# Patient Record
Sex: Female | Born: 1970 | Race: White | Hispanic: No | Marital: Single | Smoking: Never smoker
Health system: Southern US, Community
[De-identification: ages and names within clinical notes are randomized; demographics above are authoritative.]

## PROBLEM LIST (undated history)

## (undated) DIAGNOSIS — J309 Allergic rhinitis, unspecified: Secondary | ICD-10-CM

## (undated) DIAGNOSIS — N926 Irregular menstruation, unspecified: Secondary | ICD-10-CM

## (undated) DIAGNOSIS — G4720 Circadian rhythm sleep disorder, unspecified type: Secondary | ICD-10-CM

## (undated) DIAGNOSIS — Z9109 Other allergy status, other than to drugs and biological substances: Secondary | ICD-10-CM

## (undated) DIAGNOSIS — L708 Other acne: Secondary | ICD-10-CM

## (undated) DIAGNOSIS — N939 Abnormal uterine and vaginal bleeding, unspecified: Secondary | ICD-10-CM

## (undated) DIAGNOSIS — L719 Rosacea, unspecified: Secondary | ICD-10-CM

## (undated) DIAGNOSIS — J45909 Unspecified asthma, uncomplicated: Secondary | ICD-10-CM

## (undated) HISTORY — DX: Other acne: L70.8

## (undated) HISTORY — DX: Unspecified asthma, uncomplicated: J45.909

## (undated) HISTORY — DX: Other allergy status, other than to drugs and biological substances: Z91.09

## (undated) HISTORY — DX: Abnormal uterine and vaginal bleeding, unspecified: N93.9

## (undated) HISTORY — DX: Irregular menstruation, unspecified: N92.6

## (undated) HISTORY — DX: Rosacea, unspecified: L71.9

## (undated) HISTORY — PX: DILATION AND CURETTAGE OF UTERUS: SHX78

## (undated) HISTORY — DX: Allergic rhinitis, unspecified: J30.9

## (undated) HISTORY — DX: Circadian rhythm sleep disorder, unspecified type: G47.20

## (undated) HISTORY — PX: NO PAST SURGERIES: SHX2092

---

## 1999-12-06 ENCOUNTER — Other Ambulatory Visit: Admission: RE | Admit: 1999-12-06 | Discharge: 1999-12-06 | Payer: Self-pay | Admitting: Gynecology

## 2001-11-02 ENCOUNTER — Other Ambulatory Visit: Admission: RE | Admit: 2001-11-02 | Discharge: 2001-11-02 | Payer: Self-pay | Admitting: Gynecology

## 2002-03-28 ENCOUNTER — Ambulatory Visit (HOSPITAL_COMMUNITY): Admission: RE | Admit: 2002-03-28 | Discharge: 2002-03-28 | Payer: Self-pay | Admitting: Gynecology

## 2002-03-28 ENCOUNTER — Encounter: Payer: Self-pay | Admitting: Gynecology

## 2002-12-10 ENCOUNTER — Other Ambulatory Visit: Admission: RE | Admit: 2002-12-10 | Discharge: 2002-12-10 | Payer: Self-pay | Admitting: Gynecology

## 2003-12-15 ENCOUNTER — Other Ambulatory Visit: Admission: RE | Admit: 2003-12-15 | Discharge: 2003-12-15 | Payer: Self-pay | Admitting: Gynecology

## 2003-12-20 ENCOUNTER — Inpatient Hospital Stay (HOSPITAL_COMMUNITY): Admission: AD | Admit: 2003-12-20 | Discharge: 2003-12-20 | Payer: Self-pay | Admitting: Obstetrics and Gynecology

## 2003-12-21 ENCOUNTER — Encounter (INDEPENDENT_AMBULATORY_CARE_PROVIDER_SITE_OTHER): Payer: Self-pay | Admitting: Specialist

## 2003-12-21 ENCOUNTER — Ambulatory Visit (HOSPITAL_COMMUNITY): Admission: AD | Admit: 2003-12-21 | Discharge: 2003-12-21 | Payer: Self-pay | Admitting: Obstetrics and Gynecology

## 2004-04-28 ENCOUNTER — Other Ambulatory Visit: Admission: RE | Admit: 2004-04-28 | Discharge: 2004-04-28 | Payer: Self-pay | Admitting: Gynecology

## 2004-08-30 ENCOUNTER — Encounter: Admission: RE | Admit: 2004-08-30 | Discharge: 2004-09-08 | Payer: Self-pay | Admitting: Gynecology

## 2004-10-11 ENCOUNTER — Ambulatory Visit (HOSPITAL_COMMUNITY): Admission: RE | Admit: 2004-10-11 | Discharge: 2004-10-11 | Payer: Self-pay | Admitting: Gynecology

## 2004-10-11 ENCOUNTER — Ambulatory Visit: Payer: Self-pay | Admitting: Obstetrics and Gynecology

## 2004-10-27 ENCOUNTER — Inpatient Hospital Stay (HOSPITAL_COMMUNITY): Admission: AD | Admit: 2004-10-27 | Discharge: 2004-10-29 | Payer: Self-pay | Admitting: Gynecology

## 2004-12-09 ENCOUNTER — Other Ambulatory Visit: Admission: RE | Admit: 2004-12-09 | Discharge: 2004-12-09 | Payer: Self-pay | Admitting: Gynecology

## 2005-09-06 ENCOUNTER — Other Ambulatory Visit: Admission: RE | Admit: 2005-09-06 | Discharge: 2005-09-06 | Payer: Self-pay | Admitting: Gynecology

## 2005-10-22 ENCOUNTER — Emergency Department (HOSPITAL_COMMUNITY): Admission: EM | Admit: 2005-10-22 | Discharge: 2005-10-22 | Payer: Self-pay | Admitting: Emergency Medicine

## 2005-10-31 ENCOUNTER — Ambulatory Visit: Payer: Self-pay | Admitting: Cardiovascular Disease

## 2005-10-31 ENCOUNTER — Ambulatory Visit: Payer: Self-pay | Admitting: Family Medicine

## 2005-11-11 ENCOUNTER — Ambulatory Visit: Payer: Self-pay | Admitting: Family Medicine

## 2005-11-25 ENCOUNTER — Ambulatory Visit: Payer: Self-pay | Admitting: Family Medicine

## 2005-12-20 ENCOUNTER — Other Ambulatory Visit: Admission: RE | Admit: 2005-12-20 | Discharge: 2005-12-20 | Payer: Self-pay | Admitting: Gynecology

## 2005-12-20 ENCOUNTER — Encounter: Payer: Self-pay | Admitting: Family Medicine

## 2006-05-16 ENCOUNTER — Ambulatory Visit: Payer: Self-pay | Admitting: Family Medicine

## 2006-06-12 ENCOUNTER — Encounter: Admission: RE | Admit: 2006-06-12 | Discharge: 2006-06-12 | Payer: Self-pay | Admitting: Family Medicine

## 2006-08-22 ENCOUNTER — Telehealth: Payer: Self-pay | Admitting: Family Medicine

## 2006-08-22 DIAGNOSIS — L708 Other acne: Secondary | ICD-10-CM

## 2006-08-22 DIAGNOSIS — G4720 Circadian rhythm sleep disorder, unspecified type: Secondary | ICD-10-CM | POA: Insufficient documentation

## 2006-08-22 DIAGNOSIS — N926 Irregular menstruation, unspecified: Secondary | ICD-10-CM

## 2006-08-22 DIAGNOSIS — J309 Allergic rhinitis, unspecified: Secondary | ICD-10-CM

## 2006-08-22 DIAGNOSIS — N939 Abnormal uterine and vaginal bleeding, unspecified: Secondary | ICD-10-CM

## 2006-08-22 HISTORY — DX: Circadian rhythm sleep disorder, unspecified type: G47.20

## 2006-08-22 HISTORY — DX: Irregular menstruation, unspecified: N92.6

## 2006-08-22 HISTORY — DX: Allergic rhinitis, unspecified: J30.9

## 2006-08-22 HISTORY — DX: Other acne: L70.8

## 2006-08-23 ENCOUNTER — Ambulatory Visit: Payer: Self-pay | Admitting: Family Medicine

## 2006-08-24 DIAGNOSIS — J45909 Unspecified asthma, uncomplicated: Secondary | ICD-10-CM | POA: Insufficient documentation

## 2006-08-24 HISTORY — DX: Unspecified asthma, uncomplicated: J45.909

## 2006-12-13 ENCOUNTER — Telehealth: Payer: Self-pay | Admitting: Family Medicine

## 2007-01-04 ENCOUNTER — Other Ambulatory Visit: Admission: RE | Admit: 2007-01-04 | Discharge: 2007-01-04 | Payer: Self-pay | Admitting: Gynecology

## 2007-05-23 ENCOUNTER — Telehealth (INDEPENDENT_AMBULATORY_CARE_PROVIDER_SITE_OTHER): Payer: Self-pay | Admitting: *Deleted

## 2007-07-02 DIAGNOSIS — W57XXXA Bitten or stung by nonvenomous insect and other nonvenomous arthropods, initial encounter: Secondary | ICD-10-CM | POA: Insufficient documentation

## 2007-07-02 DIAGNOSIS — T148 Other injury of unspecified body region: Secondary | ICD-10-CM

## 2007-07-04 ENCOUNTER — Ambulatory Visit: Payer: Self-pay | Admitting: Family Medicine

## 2008-01-07 ENCOUNTER — Other Ambulatory Visit: Admission: RE | Admit: 2008-01-07 | Discharge: 2008-01-07 | Payer: Self-pay | Admitting: Gynecology

## 2008-01-07 ENCOUNTER — Ambulatory Visit: Payer: Self-pay | Admitting: Women's Health

## 2008-01-07 ENCOUNTER — Encounter: Payer: Self-pay | Admitting: Women's Health

## 2008-02-11 ENCOUNTER — Telehealth: Payer: Self-pay | Admitting: Family Medicine

## 2008-11-28 ENCOUNTER — Ambulatory Visit: Payer: Self-pay | Admitting: Family Medicine

## 2008-12-17 ENCOUNTER — Telehealth: Payer: Self-pay | Admitting: Family Medicine

## 2009-01-09 ENCOUNTER — Ambulatory Visit: Payer: Self-pay | Admitting: Women's Health

## 2009-01-09 ENCOUNTER — Encounter: Admission: RE | Admit: 2009-01-09 | Discharge: 2009-01-09 | Payer: Self-pay | Admitting: Family Medicine

## 2009-01-09 ENCOUNTER — Other Ambulatory Visit: Admission: RE | Admit: 2009-01-09 | Discharge: 2009-01-09 | Payer: Self-pay | Admitting: Gynecology

## 2009-01-09 LAB — HM MAMMOGRAPHY: HM Mammogram: NEGATIVE

## 2009-01-14 ENCOUNTER — Telehealth: Payer: Self-pay | Admitting: Family Medicine

## 2010-01-11 ENCOUNTER — Encounter
Admission: RE | Admit: 2010-01-11 | Discharge: 2010-01-11 | Payer: Self-pay | Source: Home / Self Care | Attending: Family Medicine | Admitting: Family Medicine

## 2010-01-11 ENCOUNTER — Other Ambulatory Visit
Admission: RE | Admit: 2010-01-11 | Discharge: 2010-01-11 | Payer: Self-pay | Source: Home / Self Care | Admitting: Gynecology

## 2010-01-11 ENCOUNTER — Ambulatory Visit: Payer: Self-pay | Admitting: Women's Health

## 2010-06-11 NOTE — Op Note (Signed)
Diane Herring, Diane Herring                ACCOUNT NO.:  [id number]   MEDICAL RECORD NO.:  [id number]          PATIENT TYPE:  AMB   LOCATION:  SDC                           [facility]:  [facility]   PHYSICIAN:  James A. Ashley Royalty, M.D.DATE OF BIRTH:  1970-04-03   DATE OF PROCEDURE:  12/21/2003  DATE OF DISCHARGE:                                 OPERATIVE REPORT   PREOPERATIVE DIAGNOSIS:  Inevitable abortion.   POSTOPERATIVE DIAGNOSIS:  Inevitable abortion.  Pathology pending.   PROCEDURE:  Suction dilatation and curettage.   SURGEON:  Rudy Jew. Ashley Royalty, M.D.   ANESTHESIA:  Monitored anesthesia care, with 1% Xylocaine paracervical block  (20 cc).   FINDINGS:  Uterus sounded to 8.5 cm.   ESTIMATED BLOOD LOSS:  50 cc.   COMPLICATIONS:  None.   PACKS AND DRAINS:  None.   O positive blood type.   PROCEDURE:  The patient was taken to the operating room and placed in the  dorsal supine position.  After adequate IV sedation was administered, she  was placed in the lithotomy position and prepped and draped in the usual  manner for vaginal surgery.  Posterior weighted retractor was placed per  vagina.  The anterior lip of the cervix was grasped with a single-tooth  tenaculum.  The uterus was gently sounded to 8.5 cm.  Twenty cc of 1%  Xylocaine was used to infiltrate the cervix circumferentially to create a  good paracervical block.  The cervix was verified as already dilated to a  size 35 [place].  A 9 mm suction curet was then introduced into the uterine  cavity and suction applied.  A moderate amount of apparent products of  conception was delivered through the tubing.  After several passes with the  suction curet, no additional tissue was obtained.  At this point, the  operative part of the procedure was terminated.  The tenaculum was removed.  There was a small bleeder at the left aspect of the tenaculum site on  the cervix which was easily controlled with a 2-0 chromic interrupted  suture.   Hemostasis was noted and the procedure terminated.   The patient tolerated the procedure extremely well and was returned to the  recovery room in good condition.      JAM/MEDQ  D:  12/21/2003  T:  12/22/2003  Job:  [id number]   cc:   Ivor Costa. Farrel Gobble, M.D.  [address], [place]  [place]  [place] [address]  Fax: [phone]

## 2010-06-11 NOTE — H&P (Signed)
Diane Herring, Diane Herring                ACCOUNT NO.:  [id number]   MEDICAL RECORD NO.:  [id number]          PATIENT TYPE:  AMB   LOCATION:  SDC                           [facility]   PHYSICIAN:  James A. Ashley Royalty, M.D.DATE OF BIRTH:  [birth year]   DATE OF PROCEDURE:  DATE OF DISCHARGE:                      STAT - MUST CHANGE TO CORRECT WORK TYPE   A 40 year old, gravida 1, para 0, last menstrual period October 08, 2003,  10 weeks and 2 days gestation by menstrual dates.  The patient conceived via  Pergonal and states her date of conception was October 20, 2003, placing  her at or about 10 weeks 5 days today.  She presented to MAU last night  after calling first complaining of vaginal bleeding starting approximately 2  a.m. on October 26.  Evaluation in MAU last evening revealed the patient to  be having bleeding at the rate of less than a normal period without passage  of any tissue.  Pelvic evaluation revealed a modest amount of blood in the  vault.  The os was closed, and uterus was approximately 9 x 6 x 5 cm.  Subsequent ultrasound revealed a intrauterine gestational sack as well as an  embryo with crown rump length consistent with 6 weeks 6 days gestation and  no cardiac activity.  Options were discussed with the patient at that time  including expectant management versus aggressive management with a  dilatation and curettage.  She elected the former.  Quantitative beta HCG  was done, and the value was approximately 3400 MIU/mL.  She is Rh positive.   The patient called this afternoon complaining of increasing lower abdominal  discomfort.  She was asked to return to MAU at [facility].  She passed  a clot since arrival at MAU which allowed her pain to go from severe to  moderate.  Nonetheless, she continues to have moderate discomfort and  requests definitive therapy in the form of dilatation and curettage.   MEDICATIONS:  Vitamins.   PAST MEDICAL HISTORY:  Medical:   Asthma.   Surgical:  Negative.   ALLERGIES:  None   FAMILY HISTORY:  Noncontributory.   SOCIAL HISTORY:  The patient does not use tobacco or significant alcohol.   REVIEW OF SYSTEMS:  Noncontributory.   PHYSICAL EXAMINATION:  GENERAL: Well-developed, well-nourished, pleasant  white female in no acute distress.  VITAL SIGNS:  Blood pressure 127/89, pulse 70, respirations 20, temperature  96.9.  SKIN:  Warm and dry without lesions.  LYMPH:  No supraclavicular, cervical, or inguinal adenopathy.  HEENT:  Normocephalic.  NECK:  Supple without thyromegaly.  CHEST: Lungs are clear.  CARDIAC:  Regular rate and rhythm without murmurs, gallops, or rubs.  BREASTS:  Exam deferred.  ABDOMEN:  Soft and nontender without masses or organomegaly.  PELVIC:  External genitalia within normal limits.  There is moderate amount  of blood in the vault.  The os is open slightly.  Bimanual examination  reveals the uterus to be approximately 9 x 6 x 5 cm.  No adnexal masses are  palpable.   LABORATORY DATA:  Repeat HCG is obtained,  and the value is 1918 MIU/mL.  Hemoglobin 13.0, hematocrit 37.5, white blood count 11,400.   IMPRESSION:  1.  Inevitable abortion.  2.  Moderate to severe pelvic pain secondary to #1.   PLAN:  Suction dilatation and curettage.  Risks, benefits, complications,  and alteratives thoroughly discussed with the patient.  She states she  understands and accepts.  Questions invited and answered.      JAM/MEDQ  D:  12/21/2003  T:  12/21/2003  Job:  [id number]

## 2010-06-11 NOTE — H&P (Signed)
Diane Herring, Diane Herring                ACCOUNT NO.:  [id number]   MEDICAL RECORD NO.:  [id number]          PATIENT TYPE:  MAT   LOCATION:  MATC                          [facility]   PHYSICIAN:  James A. Ashley Royalty, M.D.DATE OF BIRTH:  1970/12/30   DATE OF ADMISSION:  12/20/2003  DATE OF DISCHARGE:                                HISTORY & PHYSICAL   This is a 40 year old prima gravida, last menstrual period October 08, 2003, 10 weeks, 1 day gestation by menstrual dates.  The patient conceived  via Pergonal and states her date of conception was October 20, 2003,  placing her at 10 weeks, 4 days gestation by that dating parameter.  The  patient stated she noticed the onset of vaginal bleeding at 2 a.m. today.  Initially it was brown and has become redder over time.  In quantity it is  still substantially less than that of a normal menstrual period.  She denies  any passage of tissue.   MEDICATIONS:  Vitamins.   PAST MEDICAL HISTORY:  Medical asthma.   SURGICAL HISTORY:  Negative.   ALLERGIES:  None.   FAMILY HISTORY:  Noncontributory.   SOCIAL HISTORY:  The patient denies use of tobacco or significant alcohol.   REVIEW OF SYSTEMS:  Noncontributory.   PHYSICAL EXAMINATION:  GENERAL:  Well-developed, well-nourished, pleasant,  white female in no acute distress.  VITAL SIGNS:  Afebrile, vital signs stable, blood pressure 137/79.  SKIN:  Warm and dry without lesions.  LYMPH:  There is no supraclavicular, cervical, or inguinal adenopathy.  HEENT:  Normocephalic.  NECK:  Supple.  No thyromegaly.  CHEST AND LUNGS:  Clear.  CARDIAC:  Regular rate and rhythm without murmurs, gallops, or rubs.  BREAST EXAM:  Deferred.  ABDOMEN:  Soft and nontender without masses or organomegaly.  PELVIC:  External genitalia is within normal limits.  Vagina and cervix are  without gross lesions.  There is approximately 5 mL of old blood in the  vaginal vault.  The cervical os is closed.  There is  no tissue.  Bimanual  examination reveals the uterus to be approximately 9 x 6 x 5 cm and no  adnexal masses are palpable.   Obstetrical ultrasound reveals an intrauterine gestational sac.  An embryo  is visible with a crown-rump length of 8.4 mm consistent with 6 weeks, 6  days gestation.  There is no cardiac activity visible.  There was no  evidence of any subchorionic hemorrhage.  The right ovary contains a 10 x 13  mm cystic lesion.  The left ovary demonstrates a 2.2 x 2.3 x 2.7 cm  probable corpus luteum cyst.  There was no free fluid.   IMPRESSION:  Probable spontaneous abortion.   PLAN:  Discussed with patient the options for management including expectant  management with serial ECGs versus dilatation and curettage.  She elects the  former.  Hence, we will obtain a quantitative beta hCG today and repeat on  November 28 or November 29 per discretion of Dr. Farrel Gobble.  Risks, benefits,  complications, and proposed plans of management discussed and  accepted.  Questions invited and answered.      JAM/MEDQ  D:  12/20/2003  T:  12/20/2003  Job:  [id number]   cc:   Ivor Costa. Farrel Gobble, M.D.  [address], [place]  [place]  [place] [address]  Fax: [phone]

## 2010-06-11 NOTE — H&P (Signed)
NAMENICHOLLE, FALZON                ACCOUNT NO.:  [id number]   MEDICAL RECORD NO.:  [id number]          PATIENT TYPE:  INP   LOCATION:  9165                          [facility]  [facility]   PHYSICIAN:  Timothy P. Fontaine, M.D.DATE OF BIRTH:  27-Jul-1970   DATE OF ADMISSION:  10/27/2004  DATE OF DISCHARGE:                                HISTORY & PHYSICAL   CHIEF COMPLAINT:  Pregnancy at 36 weeks. Spontaneous rupture of membranes.   HISTORY OF PRESENT ILLNESS:  40 year old G2, P0 female [redacted] weeks gestation  being followed for gestational diabetes, 42 units of NPH Insulin at bedtime,  good control with spontaneous rupture of membranes at approximately 1 A.M.  The patient was admitted with fingertip dilatation with contractions every 3  to 8 minutes.  Her prenatal course has been uncomplicated other than he  gestational diabetes.  She had a recent beta Strep vaginal culture which was  negative. For the remainder of her history see her [place].   PHYSICAL EXAMINATION:  VITAL SIGNS:  She is afebrile and vital signs are  stable.  HEENT:  Normal.  LUNGS:  Clear.  CARDIAC:  Regular rate and rhythm with no murmurs, rubs or gallops.  ABDOMEN:  Gravid, vertex, consistent with dates.  External monitors with  reactive fetal tracing, irregular contractions.  PELVIC:  On pelvic exam 80% effaced, tight fingertip dilated, posterior,  vertex presentation, clear fluid noted.  IUPC placed.   ASSESSMENT/PLAN:  40 year old G2, P0 female [redacted] weeks gestation, gestational  diabetes, spontaneous rupture of membranes, initially contracting, now with  more irregular contractions.  The patient is on insulin for gestational  diabetes with good control.  Fluid noted to be clear.  Recent Strep culture  negative.  IUPC placed.  External monitors with reactive reassuring fetus.  Will begin Pitocin augmentation for planned vaginal delivery.  The plan was  reviewed with the patient and her husband who understand and  agree.      Timothy P. Fontaine, M.D.  Electronically Signed     TPF/MEDQ  D:  10/27/2004  T:  10/27/2004  Job:  [id number]

## 2010-09-16 ENCOUNTER — Encounter: Payer: Self-pay | Admitting: Family Medicine

## 2010-10-05 ENCOUNTER — Encounter: Payer: Self-pay | Admitting: Family Medicine

## 2010-11-25 ENCOUNTER — Encounter: Payer: Self-pay | Admitting: Family Medicine

## 2010-11-29 ENCOUNTER — Ambulatory Visit (INDEPENDENT_AMBULATORY_CARE_PROVIDER_SITE_OTHER): Payer: BC Managed Care – PPO | Admitting: Family Medicine

## 2010-11-29 ENCOUNTER — Encounter: Payer: Self-pay | Admitting: Family Medicine

## 2010-11-29 DIAGNOSIS — G4720 Circadian rhythm sleep disorder, unspecified type: Secondary | ICD-10-CM

## 2010-11-29 DIAGNOSIS — Z23 Encounter for immunization: Secondary | ICD-10-CM

## 2010-11-29 DIAGNOSIS — J45909 Unspecified asthma, uncomplicated: Secondary | ICD-10-CM

## 2010-11-29 DIAGNOSIS — J309 Allergic rhinitis, unspecified: Secondary | ICD-10-CM

## 2010-11-29 LAB — POCT URINALYSIS DIPSTICK
Bilirubin, UA: NEGATIVE
Glucose, UA: NEGATIVE
Ketones, UA: NEGATIVE
Leukocytes, UA: NEGATIVE
Nitrite, UA: NEGATIVE
Protein, UA: NEGATIVE
Spec Grav, UA: 1.025
Urobilinogen, UA: 0.2
pH, UA: 5

## 2010-11-29 LAB — CBC WITH DIFFERENTIAL/PLATELET
Basophils Absolute: 0 10*3/uL (ref 0.0–0.1)
Basophils Relative: 0.6 % (ref 0.0–3.0)
Eosinophils Absolute: 0.1 10*3/uL (ref 0.0–0.7)
Eosinophils Relative: 0.9 % (ref 0.0–5.0)
HCT: 38.8 % (ref 36.0–46.0)
Hemoglobin: 13.3 g/dL (ref 12.0–15.0)
Lymphocytes Relative: 30.5 % (ref 12.0–46.0)
Lymphs Abs: 2.3 10*3/uL (ref 0.7–4.0)
MCHC: 34.3 g/dL (ref 30.0–36.0)
MCV: 96.2 fl (ref 78.0–100.0)
Monocytes Absolute: 0.4 10*3/uL (ref 0.1–1.0)
Monocytes Relative: 5.4 % (ref 3.0–12.0)
Neutro Abs: 4.8 10*3/uL (ref 1.4–7.7)
Neutrophils Relative %: 62.6 % (ref 43.0–77.0)
Platelets: 207 10*3/uL (ref 150.0–400.0)
RBC: 4.04 Mil/uL (ref 3.87–5.11)
RDW: 12.1 % (ref 11.5–14.6)
WBC: 7.7 10*3/uL (ref 4.5–10.5)

## 2010-11-29 LAB — TSH: TSH: 1.74 u[IU]/mL (ref 0.35–5.50)

## 2010-11-29 LAB — HEPATIC FUNCTION PANEL
ALT: 29 U/L (ref 0–35)
AST: 18 U/L (ref 0–37)
Albumin: 3.8 g/dL (ref 3.5–5.2)
Alkaline Phosphatase: 109 U/L (ref 39–117)
Bilirubin, Direct: 0.1 mg/dL (ref 0.0–0.3)
Total Bilirubin: 0.9 mg/dL (ref 0.3–1.2)
Total Protein: 6.7 g/dL (ref 6.0–8.3)

## 2010-11-29 LAB — LIPID PANEL
Cholesterol: 184 mg/dL (ref 0–200)
HDL: 62.8 mg/dL (ref >39.00)
LDL Cholesterol: 95 mg/dL (ref 0–99)
Total CHOL/HDL Ratio: 3
Triglycerides: 131 mg/dL (ref 0.0–149.0)
VLDL: 26.2 mg/dL (ref 0.0–40.0)

## 2010-11-29 MED ORDER — BECLOMETHASONE DIPROPIONATE 40 MCG/ACT IN AERS: 2.0000 | INHALATION_SPRAY | Freq: Two times a day (BID) | RESPIRATORY_TRACT | Status: AC

## 2010-11-29 MED ORDER — ALBUTEROL SULFATE HFA 108 (90 BASE) MCG/ACT IN AERS: 2.0000 | INHALATION_SPRAY | Freq: Four times a day (QID) | RESPIRATORY_TRACT | Status: DC | PRN

## 2010-11-29 NOTE — Patient Instructions (Signed)
Continue your current medications.  Consider a consult with Dr. Rolly Pancake, [place], because of the sleep dysfunction.  Return for a 30 minute appointment sometime in the next week or two for removal of the lesions on her back

## 2010-11-29 NOTE — Progress Notes (Signed)
  Subjective:    Patient ID: Diane Herring, female    DOB: 1970/08/17, 40 y.o.   MRN: [id number]  Diane Herring is a 40 year old, married female, nonsmoker, G2, P2, who comes in today for general physical examination because of a history of allergic rhinitis, asthma, and sleep dysfunction.  She takes her Qvar and albuterol p.r.n. Only has a flare once or twice a year.  She also takes Zyrtec 10 mg nightly for allergic rhinitis.  She's tried many medications for sleep dysfunction noted, which is work.  She's currently taking over-the-counter Unisom.  We discussed consultation with Diane Herring.  She is off her BCPs because her husband Diane Herring.      Review of Systems  Constitutional: Negative.   HENT: Negative.   Eyes: Negative.   Respiratory: Negative.   Cardiovascular: Negative.   Gastrointestinal: Negative.   Genitourinary: Negative.   Musculoskeletal: Negative.   Neurological: Negative.   Hematological: Negative.   Psychiatric/Behavioral: Negative.        Objective:   Physical Exam  Constitutional: She appears well-developed and well-nourished.  HENT:  Head: Normocephalic and atraumatic.  Right Ear: External ear normal.  Left Ear: External ear normal.  Nose: Nose normal.  Mouth/Throat: Oropharynx is clear and moist.  Eyes: EOM are normal. Pupils are equal, round, and reactive to light.  Neck: Normal range of motion. Neck supple. No thyromegaly present.  Cardiovascular: Normal rate, regular rhythm, normal heart sounds and intact distal pulses.  Exam reveals no gallop and no friction rub.   No murmur heard. Pulmonary/Chest: Effort normal and breath sounds normal.  Abdominal: Soft. Bowel sounds are normal. She exhibits no distension and no mass. There is no tenderness. There is no rebound.  Genitourinary:       Breast exam shows multiple fibrocystic changes throughout both breasts.  No dominant lesion  Musculoskeletal: Normal range of motion.  Lymphadenopathy:    She  has no cervical adenopathy.  Neurological: She is alert. She has normal reflexes. No cranial nerve deficit. She exhibits normal muscle tone. Coordination normal.  Skin: Skin is warm and dry.  Psychiatric: She has a normal mood and affect. Her behavior is normal. Judgment and thought content normal.  There are two small 2-mm black lesions on her low back advised to return for removal ASAP        Assessment & Plan:  Healthy female.  History of allergic asthma.  Continue current medication.  Sleep dysfunction consider consult from Diane Herring.  Two abnormal lesions on her back.  Return for removal ASAP

## 2010-11-30 LAB — BASIC METABOLIC PANEL
BUN: 11 mg/dL (ref 6–23)
CO2: 21 mEq/L (ref 19–32)
Calcium: 9 mg/dL (ref 8.4–10.5)
Chloride: 106 mEq/L (ref 96–112)
Creatinine, Ser: 0.6 mg/dL (ref 0.4–1.2)
GFR: 112.18 mL/min (ref >60.00)
Glucose, Bld: 82 mg/dL (ref 70–99)
Potassium: 4.2 mEq/L (ref 3.5–5.1)
Sodium: 140 mEq/L (ref 135–145)

## 2010-11-30 NOTE — Progress Notes (Signed)
Addended by: Rita Ohara R on: 11/30/2010 11:19 AM   Modules accepted: Orders

## 2010-12-07 ENCOUNTER — Ambulatory Visit (INDEPENDENT_AMBULATORY_CARE_PROVIDER_SITE_OTHER): Payer: BC Managed Care – PPO | Admitting: Family Medicine

## 2010-12-07 ENCOUNTER — Encounter: Payer: Self-pay | Admitting: Family Medicine

## 2010-12-07 DIAGNOSIS — D239 Other benign neoplasm of skin, unspecified: Secondary | ICD-10-CM

## 2010-12-07 NOTE — Progress Notes (Signed)
  Subjective:    Patient ID: Diane Herring, female    DOB: [birth year], 40 y.o.   MRN: [id number]  HPI  Lean is a 40 year old, married female, nonsmoker, who comes in today for removal of 3 lesions on her back.  We saw her last week for a physical examination.  She has slight skin and light eyes.  Total body skin exam was normal except for 3 black lesions on her back.  After informed consent.  Each lesion was cleaned and then anesthetized with 1% Xylocaine with epinephrine.  They were removed with 3-mm margins bases were cauterized.  Band-Aids were applied.  She tolerated the procedure.  No complications.  Lesion number one ,,,,,,,,,, midline T10, 5 mm times 5 mm  Lesion number two.......... To the right lumbar area 5 mm times 5 mm.  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  Path pending.  Rule out melanoma

## 2010-12-07 NOTE — Patient Instructions (Addendum)
Remove the Band-Aid tomorrow.  If we do not call you within two weeks.  Call us

## 2010-12-08 ENCOUNTER — Other Ambulatory Visit: Payer: Self-pay | Admitting: Family Medicine

## 2010-12-08 DIAGNOSIS — Z1231 Encounter for screening mammogram for malignant neoplasm of breast: Secondary | ICD-10-CM

## 2011-01-13 ENCOUNTER — Ambulatory Visit: Payer: BC Managed Care – PPO

## 2011-01-21 ENCOUNTER — Encounter: Payer: BC Managed Care – PPO | Admitting: Women's Health

## 2011-01-25 HISTORY — PX: BREAST BIOPSY: SHX20

## 2011-01-27 ENCOUNTER — Ambulatory Visit (INDEPENDENT_AMBULATORY_CARE_PROVIDER_SITE_OTHER): Payer: BC Managed Care – PPO | Admitting: Women's Health

## 2011-01-27 ENCOUNTER — Encounter: Payer: Self-pay | Admitting: Women's Health

## 2011-01-27 ENCOUNTER — Other Ambulatory Visit (HOSPITAL_COMMUNITY)
Admission: RE | Admit: 2011-01-27 | Discharge: 2011-01-27 | Disposition: A | Discharge status: Home / Self Care | Payer: BC Managed Care – PPO | Source: Ambulatory Visit | Attending: Obstetrics and Gynecology | Admitting: Obstetrics and Gynecology

## 2011-01-27 VITALS — BP 108/74 | Ht 61.0 in | Wt 138.0 lb

## 2011-01-27 DIAGNOSIS — Z01419 Encounter for gynecological examination (general) (routine) without abnormal findings: Secondary | ICD-10-CM | POA: Insufficient documentation

## 2011-01-27 NOTE — Progress Notes (Signed)
Addended by: Venora Maples on: 01/27/2011 11:26 AM   Modules accepted: Orders

## 2011-01-27 NOTE — Progress Notes (Signed)
Diane UNCAPHER 1970/09/17 [id number]    History:    The patient presents for annual exam. Monthly 3 day cycle/vasectomy with no complaints. History of normal Paps since 2006. Labs at primary care.    Past medical history, past surgical history, family history and social history were all reviewed and documented in the EPIC chart. History of GDM and asthma. [name] 5 doing well, stepchildren 17 and 14 doing well and both had Gardasil.   ROS:  A  ROS was performed and pertinent positives and negatives are included in the history.  Exam:  Filed Vitals:   01/27/11 1007  BP: 108/74    General appearance:  Normal Head/Neck:  Normal, without cervical or supraclavicular adenopathy. Thyroid:  Symmetrical, normal in size, without palpable masses or nodularity. Respiratory  Effort:  Normal  Auscultation:  Clear without wheezing or rhonchi Cardiovascular  Auscultation:  Regular rate, without rubs, murmurs or gallops  Edema/varicosities:  Not grossly evident Abdominal  Soft,nontender, without masses, guarding or rebound.  Liver/spleen:  No organomegaly noted  Hernia:  None appreciated  Skin  Inspection:  Grossly normal  Palpation:  Grossly normal Neurologic/psychiatric  Orientation:  Normal with appropriate conversation.  Mood/affect:  Normal  Genitourinary    Breasts: Examined lying and sitting.     Right: Without masses, retractions, discharge or axillary adenopathy.     Left: Without masses, retractions, discharge or axillary adenopathy.   Inguinal/mons:  Normal without inguinal adenopathy  External genitalia:  Normal  BUS/Urethra/Skene's glands:  Normal  Bladder:  Normal  Vagina:  Normal  Cervix:  Normal  Uterus:   normal in size, shape and contour.  Midline and mobile  Adnexa/parametria:     Rt: Without masses or tenderness.   Lt: Without masses or tenderness.  Anus and perineum: Normal  Digital rectal exam: Normal sphincter tone without palpated masses or  tenderness  Assessment/Plan:  41 y.o. MWF G1 P1 for annual exam.    Normal GYN exam Asthma-primary care/ inhaler in labs  Plan: Pap only. SBEs, annual mammogram, (scheduled next week) exercise, calcium rich diet encouraged. Continue care at  primary care for labs and asthma medication.  Harrington Challenger [facility], 10:54 AM 01/27/2011

## 2011-01-31 ENCOUNTER — Telehealth: Payer: Self-pay | Admitting: *Deleted

## 2011-01-31 NOTE — Telephone Encounter (Signed)
Stop the amoxicillin and come to the office at 815 in the morning

## 2011-01-31 NOTE — Telephone Encounter (Signed)
Pt was given Amoxicillin at [facility] for sinus last Friday and has a rash.  Wants to know if Dr. Tawanna Cooler will see her or tell her what to do?

## 2011-01-31 NOTE — Telephone Encounter (Signed)
Spoke with patient and she will be here tomorrow at 8:15.

## 2011-02-01 ENCOUNTER — Encounter: Payer: Self-pay | Admitting: Family Medicine

## 2011-02-01 ENCOUNTER — Ambulatory Visit (INDEPENDENT_AMBULATORY_CARE_PROVIDER_SITE_OTHER): Payer: BC Managed Care – PPO | Admitting: Family Medicine

## 2011-02-01 ENCOUNTER — Ambulatory Visit
Admission: RE | Admit: 2011-02-01 | Discharge: 2011-02-01 | Disposition: A | Discharge status: Home / Self Care | Payer: BC Managed Care – PPO | Source: Ambulatory Visit | Attending: Family Medicine | Admitting: Family Medicine

## 2011-02-01 DIAGNOSIS — J309 Allergic rhinitis, unspecified: Secondary | ICD-10-CM

## 2011-02-01 DIAGNOSIS — Z1231 Encounter for screening mammogram for malignant neoplasm of breast: Secondary | ICD-10-CM

## 2011-02-01 DIAGNOSIS — Z888 Allergy status to other drugs, medicaments and biological substances status: Secondary | ICD-10-CM

## 2011-02-01 DIAGNOSIS — T7840XA Allergy, unspecified, initial encounter: Secondary | ICD-10-CM | POA: Insufficient documentation

## 2011-02-01 DIAGNOSIS — J45909 Unspecified asthma, uncomplicated: Secondary | ICD-10-CM

## 2011-02-01 MED ORDER — PREDNISONE 20 MG PO TABS: ORAL_TABLET | ORAL | Status: DC

## 2011-02-01 NOTE — Patient Instructions (Signed)
Take the prednisone as directed  Stop the  Qvar for the next 9 days........Marland KitchenMarland KitchenAnd then restart the Qvar when you begin to taper the prednisone Monday Wednesday Friday  Use the nasal irrigation with Afrin at bedtime for the next 5 nights  Return p.r.n.

## 2011-02-01 NOTE — Progress Notes (Signed)
  Subjective:    Patient ID: Diane Herring, female    DOB: 1970/10/31, 41 y.o.   MRN: [id number]  HPI Cj is a 41 year old married female nonsmoker who comes in today for evaluation of multiple issues  She states the day after Christmas she developed a cold manifested by head congestion sore throat and mild cough.  Five days ago she went to an urgent care because of head congestion was told she had sinusitis and given amoxicillin.  She took it and then 4 days later broke out in a rash.  She has a history of underlying allergic rhinitis and asthma.  She is currently taken Zyrtec, albuterol p.r.n., Q. There two puffs b.i.d.  She has no fever no facial pain  She called yesterday and we advised her to stop the amoxicillin.  She had no symptoms of airway obstruction.  In no previous history of drug reactions no   Review of Systems General and ENT and pulmonary review of systems otherwise negative    Objective:   Physical Exam  Well-developed well-nourished female in no acute distress HEENT negative except for postnasal edema neck supple no adenopathy lungs are clear except for mild expiratory wheezing.  Skin exam shows a reticular rash on her abdomen arms consistent with a mild allergic reaction.      Assessment & Plan:  Viral syndrome  Allergic rhinitis  Asthma mild  Drug reaction from amoxicillin  Plan hold of the Qvar begin prednisone burst and taper which will cover the allergic reaction and the mild asthma.  Return p.r.n. and

## 2011-02-04 ENCOUNTER — Other Ambulatory Visit: Payer: Self-pay | Admitting: Family Medicine

## 2011-02-04 DIAGNOSIS — R928 Other abnormal and inconclusive findings on diagnostic imaging of breast: Secondary | ICD-10-CM

## 2011-02-18 ENCOUNTER — Other Ambulatory Visit: Payer: Self-pay | Admitting: Family Medicine

## 2011-02-18 ENCOUNTER — Ambulatory Visit
Admission: RE | Admit: 2011-02-18 | Discharge: 2011-02-18 | Disposition: A | Discharge status: Home / Self Care | Payer: BC Managed Care – PPO | Source: Ambulatory Visit | Attending: Family Medicine | Admitting: Family Medicine

## 2011-02-18 DIAGNOSIS — N6320 Unspecified lump in the left breast, unspecified quadrant: Secondary | ICD-10-CM

## 2011-02-18 DIAGNOSIS — R928 Other abnormal and inconclusive findings on diagnostic imaging of breast: Secondary | ICD-10-CM

## 2011-02-28 ENCOUNTER — Ambulatory Visit
Admission: RE | Admit: 2011-02-28 | Discharge: 2011-02-28 | Disposition: A | Discharge status: Home / Self Care | Payer: BC Managed Care – PPO | Source: Ambulatory Visit | Attending: Family Medicine | Admitting: Family Medicine

## 2011-02-28 ENCOUNTER — Other Ambulatory Visit: Payer: Self-pay | Admitting: Family Medicine

## 2011-02-28 DIAGNOSIS — N6320 Unspecified lump in the left breast, unspecified quadrant: Secondary | ICD-10-CM

## 2011-02-28 DIAGNOSIS — R928 Other abnormal and inconclusive findings on diagnostic imaging of breast: Secondary | ICD-10-CM

## 2011-06-16 ENCOUNTER — Ambulatory Visit (INDEPENDENT_AMBULATORY_CARE_PROVIDER_SITE_OTHER): Payer: BC Managed Care – PPO | Admitting: Family Medicine

## 2011-06-16 ENCOUNTER — Encounter: Payer: Self-pay | Admitting: Family Medicine

## 2011-06-16 VITALS — BP 110/74 | Temp 98.5°F | Wt 142.0 lb

## 2011-06-16 DIAGNOSIS — G4733 Obstructive sleep apnea (adult) (pediatric): Secondary | ICD-10-CM | POA: Insufficient documentation

## 2011-06-16 DIAGNOSIS — G4730 Sleep apnea, unspecified: Secondary | ICD-10-CM

## 2011-06-16 DIAGNOSIS — J309 Allergic rhinitis, unspecified: Secondary | ICD-10-CM

## 2011-06-16 DIAGNOSIS — J45909 Unspecified asthma, uncomplicated: Secondary | ICD-10-CM

## 2011-06-16 MED ORDER — MONTELUKAST SODIUM 10 MG PO TABS: 10.0000 mg | ORAL_TABLET | Freq: Every day | ORAL | Status: DC

## 2011-06-16 MED ORDER — PREDNISONE 20 MG PO TABS: ORAL_TABLET | ORAL | Status: DC

## 2011-06-16 NOTE — Patient Instructions (Signed)
Singulair 10 mg,,,,,,,,, 1 tablet daily at bedtime  Continue Zyrtec 10 mg each bedtime  Restart the prednisone 2 tabs for 3 days with a taper

## 2011-06-16 NOTE — Progress Notes (Signed)
  Subjective:    Patient ID: Diane Herring, female    DOB: 07-27-1970, 41 y.o.   MRN: [id number]  HPI Diane Herring is a 41 year old married female nonsmoker who comes in today for evaluation of allergic rhinitis and asthma  Her allergies flared up about 2 months ago. She takes Zyrtec daily and took a three-week course of prednisone which she finished 10 days ago yesterday she began wheezing.  In the past I referred her to Dr. [name] because of sleep dysfunction. Referred her for a sleep study and she was diagnosed as sleep apnea however she's not started the CPAP  Review of Systems    general and pulmonary review of systems otherwise negative Objective:   Physical Exam Well-developed well-nourished female in no acute distress HEENT negative neck was supple no adenopathy lungs are clear except for some mild wheezing late expiratory       Assessment & Plan:  Allergic rhinitis  Asthma  Plan..........Marland Kitchen restart prednisone continue Zyrtec add Singulair return in 2 weeks for followup

## 2011-06-30 ENCOUNTER — Ambulatory Visit: Payer: BC Managed Care – PPO | Admitting: Family Medicine

## 2011-07-04 ENCOUNTER — Ambulatory Visit (INDEPENDENT_AMBULATORY_CARE_PROVIDER_SITE_OTHER): Payer: BC Managed Care – PPO | Admitting: Family Medicine

## 2011-07-04 ENCOUNTER — Encounter: Payer: Self-pay | Admitting: Family Medicine

## 2011-07-04 VITALS — BP 110/80 | HR 88 | Temp 98.8°F | Wt 144.0 lb

## 2011-07-04 DIAGNOSIS — J45909 Unspecified asthma, uncomplicated: Secondary | ICD-10-CM

## 2011-07-04 DIAGNOSIS — G4730 Sleep apnea, unspecified: Secondary | ICD-10-CM

## 2011-07-04 DIAGNOSIS — J309 Allergic rhinitis, unspecified: Secondary | ICD-10-CM

## 2011-07-04 MED ORDER — KETOTIFEN FUMARATE 0.025 % OP SOLN: 1.0000 [drp] | Freq: Two times a day (BID) | OPHTHALMIC | Status: AC

## 2011-07-04 NOTE — Progress Notes (Signed)
  Subjective:    Patient ID: Diane Herring, female    DOB: [birth year], 41 y.o.   MRN: [id number]  HPI Diane Herring is a 41 year old married female nonsmoker who comes in today for evaluation of 2 problems  We are tapering her prednisone and this is her last week of a half a tablet Monday Wednesday Friday. While she's been on the prednisone she's held the Qvar. She continues the Singulair 10 mg daily  She also has a history of sleep dysfunction I initially sent her to Diane Herring because of all the medications we tried did not help.. She felt like she might have sleep apnea and did a sleep study which confirmed it however Diane Herring has been reluctant to try the CPAP. I will ask her to see Diane Herring for a consult She's also complaining of swelling and itching of her eyes despite taking the Zyrtec 10 mg at bedtime   Review of Systems    general and pulmonary review of systems otherwise negative Objective:   Physical Exam  Well-developed well-nourished female in no acute distress HEENT negative neck was supple no adenopathy lungs are clear      Assessment & Plan:  Allergic asthma much improved plan Taper prednisone restart Qvar  Allergic eyes eyedrops  History of sleep apnea consult with Dr.  Mellody Herring

## 2011-07-04 NOTE — Patient Instructions (Signed)
Use the eyedrops ,,,,,,,,, 1 drop in each eye twice daily  I put in a consult request for you to see Dr. Mellody Dance clance who is the chief of the sleep apnea division and pulmonary  Restart Qvar 2 puffs twice daily  Return yearly for a thorough skin exam

## 2011-07-27 ENCOUNTER — Ambulatory Visit (INDEPENDENT_AMBULATORY_CARE_PROVIDER_SITE_OTHER): Payer: BC Managed Care – PPO | Admitting: Pulmonary Disease

## 2011-07-27 ENCOUNTER — Encounter: Payer: Self-pay | Admitting: Pulmonary Disease

## 2011-07-27 VITALS — BP 116/62 | HR 105 | Temp 98.7°F | Ht 61.0 in | Wt 143.8 lb

## 2011-07-27 DIAGNOSIS — G4733 Obstructive sleep apnea (adult) (pediatric): Secondary | ICD-10-CM

## 2011-07-27 NOTE — Progress Notes (Signed)
Subjective:    Patient ID: Diane Herring, female    DOB: [birth year], 41 y.o.   MRN: [id number]  HPI The patient is a 41 year old female who I've been asked to see for management of obstructive sleep apnea.  She underwent a sleep study in December of last year, where she was found to have moderate obstructive sleep apnea.  Was found to have an AHI of 27 events per hour.  The patient states she has been noted to have loud snoring, but no one has commented on an abnormal breathing pattern during sleep.  She has frequent awakenings at night, and is not rested in the mornings upon arising.  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  She has no issues in the evenings with excessive sleepiness or dozing, and has no issues with sleepiness while driving except occasionally with long distances.  The patient states that her weight is up about 5 pounds over the last 2 years, and her Epworth score today is 10.  Sleep Questionnaire: What time do you typically go to bed?( Between what hours) 10-11 pm How long does it take you to fall asleep? 30-60 minutes How many times during the night do you wake up? 20 What time do you get out of bed to start your day? 0800 Do you drive or operate heavy machinery in your occupation? No How much has your weight changed (up or down) over the past two years? (In pounds) 15 lb (6.804 kg) Have you ever had a sleep study before? Yes If yes, location of study? [facility] If yes, date of study? 12/2010 Do you currently use CPAP? No Do you wear oxygen at any time? No     Review of Systems  Constitutional: Negative for fever and unexpected weight change.  HENT: Positive for congestion. Negative for ear pain, nosebleeds, sore throat, rhinorrhea, sneezing, trouble swallowing, dental problem, postnasal drip and sinus pressure.   Eyes:  Negative for redness and itching.  Respiratory: Positive for cough and shortness of breath. Negative for chest tightness and wheezing.   Cardiovascular: Positive for leg swelling. Negative for palpitations.  Gastrointestinal: Negative for nausea and vomiting.  Genitourinary: Negative for dysuria.  Musculoskeletal: Negative for joint swelling.  Skin: Negative for rash.  Neurological: Positive for headaches.  Hematological: Does not bruise/bleed easily.  Psychiatric/Behavioral: Negative for dysphoric mood. The patient is not nervous/anxious.   All other systems reviewed and are negative.       Objective:   Physical Exam Constitutional:  Overweight female, no acute distress  HENT:  Nares patent without discharge but narrowed.  Oropharynx without exudate, palate and uvula are mildly elongated, small OP space with side wall narrowing.   Eyes:  Perrla, eomi, no scleral icterus  Neck:  No JVD, no TMG  Cardiovascular:  Normal rate, regular rhythm, no rubs or gallops.  No murmurs        Intact distal pulses  Pulmonary :  Normal breath sounds, no stridor or respiratory distress   No rales, rhonchi, or wheezing  Abdominal:  Soft, nondistended, bowel sounds present.  No tenderness noted.   Musculoskeletal:  No lower extremity edema noted.  Lymph Nodes:  No cervical lymphadenopathy noted  Skin:  No cyanosis noted  Neurologic:  Alert, appropriate, moves all 4 extremities without obvious deficit.  Assessment & Plan:

## 2011-07-27 NOTE — Patient Instructions (Addendum)
Work on [employer] loss Think about more aggressive treatment options such as dental appliance or cpap.  Please call if you would like to try more aggressive therapy.

## 2011-07-27 NOTE — Assessment & Plan Note (Signed)
The patient has moderate obstructive sleep apnea by her sleep study the end of last year, but only has moderate sleep disruption from this.  She does not feel that it is bothering her husband, and has had adequate alertness and functional status during the day.  She has no significant underlying health issues that can be affected by this.  I have reviewed the pathophysiology of sleep apnea with her, including its impact to her quality of life and cardiovascular health.  I have outlined conservative treatment options such as a trial of weight loss alone for the next 6 months, as well as more aggressive treatment options such as dental appliance for CPAP.  At this time, the patient wishes to take a conservative path and work aggressively on weight loss.  She will call me if she wishes to try additional treatment.

## 2011-10-18 ENCOUNTER — Other Ambulatory Visit: Payer: Self-pay | Admitting: Family Medicine

## 2011-11-28 ENCOUNTER — Ambulatory Visit (INDEPENDENT_AMBULATORY_CARE_PROVIDER_SITE_OTHER): Payer: BC Managed Care – PPO | Admitting: Family Medicine

## 2011-11-28 ENCOUNTER — Encounter: Payer: Self-pay | Admitting: Family Medicine

## 2011-11-28 VITALS — BP 140/80 | Temp 99.3°F | Wt 141.0 lb

## 2011-11-28 DIAGNOSIS — J309 Allergic rhinitis, unspecified: Secondary | ICD-10-CM

## 2011-11-28 DIAGNOSIS — J45909 Unspecified asthma, uncomplicated: Secondary | ICD-10-CM

## 2011-11-28 MED ORDER — DOXYCYCLINE HYCLATE 100 MG PO TABS: 100.0000 mg | ORAL_TABLET | Freq: Two times a day (BID) | ORAL | Status: DC

## 2011-11-28 MED ORDER — HYDROCODONE-HOMATROPINE 5-1.5 MG/5ML PO SYRP: ORAL_SOLUTION | ORAL | Status: DC

## 2011-11-28 MED ORDER — PREDNISONE 20 MG PO TABS: ORAL_TABLET | ORAL | Status: DC

## 2011-11-28 NOTE — Progress Notes (Signed)
  Subjective:    Patient ID: Diane Herring, female    DOB: [birth year], 41 y.o.   MRN: [id number]  HPI Diane Herring is a 41 year old married female nonsmoker who comes in today for followup of asthma  She has a history of chronic recurrent asthma has been well maintained on Qvar 40 dose 2 puffs twice a day, Zyrtec 10 mg daily, Singulair 10 mg daily  She takes albuterol inhaler when necessary  About a month ago her asthma flared up unknown etiology and she began a short course of prednisone like she's done previously. She took 3 tabs for 3 days and tapered and she doesn't feel like she is a whole better. No fever chills or sputum production.   Review of Systems Pulmonary review of systems otherwise negative    Objective:   Physical Exam Well-developed and nourished female no acute distress HEENT negative neck was supple lungs showed symmetrical breath sounds inspiratory and expiratory mild wheezing       Assessment & Plan:  Asthma plan 60 mg of prednisone daily until she is well  Add doxycycline twice a day for 2 weeks  Drink lots of water  Add the home held nebulizer with albuterol  Followup in one week

## 2011-11-28 NOTE — Addendum Note (Signed)
Addended by: Kern Reap B on: 11/28/2011 03:04 PM   Modules accepted: Orders

## 2011-11-28 NOTE — Patient Instructions (Addendum)
Take 3 prednisone tablets daily until the wheezing is markedly improved and then begin tapering,,,,,,,,, 2 tabs x3 days, 1 tab x3 days, a half a tab x3 days, then a half a tablet Monday Wednesday Friday for a 3 week taper  Drink lots of water  Hydromet 1/2-1 teaspoon 3 times daily as needed for cough  Albuterol with hand held nebulizer 4 times daily  Stop the albuterol canister  Continue your other medications  Followup in one week  Doxycycline 100 mg twice daily for 2 weeks

## 2011-12-02 ENCOUNTER — Telehealth: Payer: Self-pay | Admitting: *Deleted

## 2011-12-02 MED ORDER — ALBUTEROL SULFATE (2.5 MG/3ML) 0.083% IN NEBU: 2.5000 mg | INHALATION_SOLUTION | Freq: Four times a day (QID) | RESPIRATORY_TRACT | Status: DC | PRN

## 2011-12-02 NOTE — Telephone Encounter (Signed)
Rx sent to pharmacy and patient is aware 

## 2011-12-05 ENCOUNTER — Ambulatory Visit (INDEPENDENT_AMBULATORY_CARE_PROVIDER_SITE_OTHER): Payer: BC Managed Care – PPO | Admitting: Family Medicine

## 2011-12-05 ENCOUNTER — Encounter: Payer: Self-pay | Admitting: Family Medicine

## 2011-12-05 VITALS — BP 120/80 | Temp 98.3°F | Wt 142.0 lb

## 2011-12-05 DIAGNOSIS — J45909 Unspecified asthma, uncomplicated: Secondary | ICD-10-CM

## 2011-12-05 NOTE — Patient Instructions (Signed)
Prednisone,,,,,,,,, 2 tabs x5 days, 1 tab x5 days, a half a tab x5 days, then a half a tablet Monday Wednesday Friday for a 3 week taper  Return when necessary,

## 2011-12-05 NOTE — Progress Notes (Signed)
  Subjective:    Patient ID: Diane Herring, female    DOB: Jul 20, 1970, 41 y.o.   MRN: [id number]  HPI Diane Herring is a 41 year old married female nonsmoker who comes in today for followup of asthma  She's been on 60 mg of prednisone daily until today when she dropped her dose to 40 mg because she feels much better. Last night was the first night she was able to sleep all night without wheezing. She continues to use her albuterol when necessary, antibiotics twice a day, Hydromet cough and Singulair 10 mg daily.    Review of Systems General and pulmonary review of systems otherwise negative    Objective:   Physical Exam Well-developed well-nourished female no acute distress HEENT negative neck was supple no adenopathy lungs are clear except for some late symmetrical expiratory wheezing       Assessment & Plan:  Asthma resolving plan Taper prednisone slowly return when necessary

## 2012-02-02 ENCOUNTER — Encounter: Payer: BC Managed Care – PPO | Admitting: Women's Health

## 2012-02-08 ENCOUNTER — Encounter: Payer: Self-pay | Admitting: Women's Health

## 2012-02-08 ENCOUNTER — Other Ambulatory Visit: Payer: Self-pay | Admitting: Family Medicine

## 2012-02-08 ENCOUNTER — Ambulatory Visit (INDEPENDENT_AMBULATORY_CARE_PROVIDER_SITE_OTHER): Payer: BC Managed Care – PPO | Admitting: Women's Health

## 2012-02-08 VITALS — BP 126/82 | Ht 61.5 in | Wt 147.0 lb

## 2012-02-08 DIAGNOSIS — N63 Unspecified lump in unspecified breast: Secondary | ICD-10-CM

## 2012-02-08 DIAGNOSIS — Z01419 Encounter for gynecological examination (general) (routine) without abnormal findings: Secondary | ICD-10-CM

## 2012-02-08 NOTE — Progress Notes (Signed)
Diane Herring August 18, 1970 [id number]    History:    The patient presents for annual exam.  Regular monthly cycles/vasectomy. History of normal Paps since 2007, LGSIL/CIN-1 2006. History of GDM. Asthma/allergies-primary care. February 2013 left breast biopsy fibrocystic with no malignancy noted.   Past medical history, past surgical history, family history and social history were all reviewed and documented in the EPIC chart. Works in HR. Diane Herring age 42 doing well. 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   ROS:  A  ROS was performed and pertinent positives and negatives are included in the history.  Exam:  Filed Vitals:   02/08/12 1035  BP: 126/82    General appearance:  Normal Head/Neck:  Normal, without cervical or supraclavicular adenopathy. Thyroid:  Symmetrical, normal in size, without palpable masses or nodularity. Respiratory  Effort:  Normal  Auscultation:  Clear without wheezing or rhonchi Cardiovascular  Auscultation:  Regular rate, without rubs, murmurs or gallops  Edema/varicosities:  Not grossly evident Abdominal  Soft,nontender, without masses, guarding or rebound.  Liver/spleen:  No organomegaly noted  Hernia:  None appreciated  Skin  Inspection:  Grossly normal  Palpation:  Grossly normal Neurologic/psychiatric  Orientation:  Normal with appropriate conversation.  Mood/affect:  Normal  Genitourinary    Breasts: Examined lying and sitting.     Right: Without masses, retractions, discharge or axillary adenopathy.     Left: Without masses, retractions, discharge or axillary adenopathy.   Inguinal/mons:  Normal without inguinal adenopathy  External genitalia:  Normal  BUS/Urethra/Skene's glands:  Normal  Bladder:  Normal  Vagina:  Normal  Cervix:  Normal  Uterus:   normal in size, shape and contour.  Midline and mobile  Adnexa/parametria:     Rt: Without masses or tenderness.   Lt: Without masses or tenderness.  Anus and  perineum: Normal  Digital rectal exam: Normal sphincter tone without palpated masses or tenderness  Assessment/Plan:  42 y.o. M. WF G1 P1 for annual exam with no complaints.  Normal GYN exam/vasectomy Allergy/asthma-primary care  Plan: SBE's, continue annual mammogram, exercise, calcium rich diet, vitamin D 1000 daily encouraged. Labs at primary care reports normal. Pap normal 2013, new screening guidelines reviewed.      Harrington Challenger WHNP, 2:02 PM 02/08/2012

## 2012-02-08 NOTE — Patient Instructions (Addendum)

## 2012-03-10 ENCOUNTER — Other Ambulatory Visit: Payer: Self-pay

## 2012-03-13 ENCOUNTER — Ambulatory Visit
Admission: RE | Admit: 2012-03-13 | Discharge: 2012-03-13 | Disposition: A | Discharge status: Home / Self Care | Payer: BC Managed Care – PPO | Source: Ambulatory Visit | Attending: Family Medicine | Admitting: Family Medicine

## 2012-03-13 DIAGNOSIS — N63 Unspecified lump in unspecified breast: Secondary | ICD-10-CM

## 2012-11-29 ENCOUNTER — Other Ambulatory Visit: Payer: Self-pay

## 2013-02-07 ENCOUNTER — Other Ambulatory Visit: Payer: Self-pay

## 2013-02-07 DIAGNOSIS — Z1231 Encounter for screening mammogram for malignant neoplasm of breast: Secondary | ICD-10-CM

## 2013-02-12 ENCOUNTER — Encounter: Payer: Self-pay | Admitting: Women's Health

## 2013-02-14 ENCOUNTER — Other Ambulatory Visit: Payer: Self-pay | Admitting: Family Medicine

## 2013-02-21 ENCOUNTER — Ambulatory Visit (INDEPENDENT_AMBULATORY_CARE_PROVIDER_SITE_OTHER): Payer: BC Managed Care – PPO | Admitting: Family Medicine

## 2013-02-21 ENCOUNTER — Encounter: Payer: Self-pay | Admitting: Family Medicine

## 2013-02-21 ENCOUNTER — Telehealth: Payer: Self-pay | Admitting: Family Medicine

## 2013-02-21 ENCOUNTER — Encounter: Payer: Self-pay | Admitting: Women's Health

## 2013-02-21 VITALS — BP 120/80 | HR 115 | Temp 99.0°F | Wt 135.0 lb

## 2013-02-21 DIAGNOSIS — J45909 Unspecified asthma, uncomplicated: Secondary | ICD-10-CM

## 2013-02-21 MED ORDER — HYDROCODONE-HOMATROPINE 5-1.5 MG/5ML PO SYRP: 5.0000 mL | ORAL_SOLUTION | Freq: Three times a day (TID) | ORAL | Status: AC | PRN

## 2013-02-21 MED ORDER — MONTELUKAST SODIUM 10 MG PO TABS: ORAL_TABLET | ORAL | Status: DC

## 2013-02-21 MED ORDER — ALBUTEROL SULFATE HFA 108 (90 BASE) MCG/ACT IN AERS: 2.0000 | INHALATION_SPRAY | Freq: Four times a day (QID) | RESPIRATORY_TRACT | Status: AC | PRN

## 2013-02-21 MED ORDER — PREDNISONE 20 MG PO TABS: ORAL_TABLET | ORAL | Status: AC

## 2013-02-21 NOTE — Progress Notes (Signed)
   Subjective:    Patient ID: Diane Herring, female    DOB: 08/27/1970, 43 y.o.   MRN: [id number]  HPI Diane Herring is a 43 year old married female nonsmoker who comes in today for flare of her asthma  She states her asthma flared up about 8 days ago when she was around a neighbor who has a cat. She has albuterol for nebulizer but does not have the Ventolin MDI. She's been using the Qvar 2 puffs twice daily has been off her Singulair. No fever no sputum production unable to sleep last night because of coughing   Review of Systems Review of systems otherwise negative    Objective:   Physical Exam Well-developed well-nourished female no acute distress vital signs stable she's afebrile pulse ox 97 on room air respiratory rate 12 and unlabored  HEENT negative neck was supple no adenopathy lungs are clear except for some mild to moderate wheezing on forced expiration       Assessment & Plan:  Viral syndrome in the setting of underlying asthma plan see orders

## 2013-02-21 NOTE — Telephone Encounter (Signed)
Pt needs refill on inhaler and hasn't been seen since 2013. Ok to schedule at 2pm cpe slot?

## 2013-02-21 NOTE — Telephone Encounter (Signed)
ok 

## 2013-02-21 NOTE — Telephone Encounter (Signed)
done

## 2013-02-21 NOTE — Patient Instructions (Signed)
Drink lots water  Day pricer  Prednisone 20 mg,,,,,,,,,,,,, 3 now then 2 tabs daily for 3 days then taper as outlined  Hydromet,,,,,, 1/2-1 teaspoon 3 times daily when necessary for cough  Restart the Singulair  You can use the albuterol uses the inhaler or the nebulizer 3 times daily when necessary  Return when necessary

## 2013-03-07 ENCOUNTER — Encounter: Payer: Self-pay | Admitting: Women's Health

## 2013-03-15 ENCOUNTER — Ambulatory Visit
Admission: RE | Admit: 2013-03-15 | Discharge: 2013-03-15 | Disposition: A | Discharge status: Home / Self Care | Payer: BC Managed Care – PPO | Source: Ambulatory Visit

## 2013-03-15 DIAGNOSIS — Z1231 Encounter for screening mammogram for malignant neoplasm of breast: Secondary | ICD-10-CM

## 2013-04-02 ENCOUNTER — Encounter: Payer: Self-pay | Admitting: Women's Health

## 2013-05-02 ENCOUNTER — Encounter: Payer: Self-pay | Admitting: Women's Health

## 2013-05-24 ENCOUNTER — Encounter: Payer: Self-pay | Admitting: Women's Health

## 2013-05-24 ENCOUNTER — Ambulatory Visit (INDEPENDENT_AMBULATORY_CARE_PROVIDER_SITE_OTHER): Payer: BC Managed Care – PPO | Admitting: Women's Health

## 2013-05-24 ENCOUNTER — Other Ambulatory Visit (HOSPITAL_COMMUNITY)
Admission: RE | Admit: 2013-05-24 | Discharge: 2013-05-24 | Disposition: A | Discharge status: Home / Self Care | Payer: BC Managed Care – PPO | Source: Ambulatory Visit | Attending: Women's Health | Admitting: Women's Health

## 2013-05-24 VITALS — BP 124/78 | Ht 61.25 in | Wt 138.0 lb

## 2013-05-24 DIAGNOSIS — Z01419 Encounter for gynecological examination (general) (routine) without abnormal findings: Secondary | ICD-10-CM | POA: Insufficient documentation

## 2013-05-24 DIAGNOSIS — Z1151 Encounter for screening for human papillomavirus (HPV): Secondary | ICD-10-CM | POA: Insufficient documentation

## 2013-05-24 NOTE — Progress Notes (Signed)
Diane Herring 02-16-1970 [id number]    History:    Presents for annual exam.  Regular monthly cycle/vasectomy. 2006 LGSIL with normal Paps after. 2013 negative breast biopsy with normal mammograms after. History of gestational diabetes. Labs at primary care.  Past medical history, past surgical history, family history and social history were all reviewed and documented in the EPIC chart. Works in [employer] in Engineer, materials. 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  ROS:  A  12 POINT ROS was performed and pertinent positives and negatives are included.  Exam:  Filed Vitals:   05/24/13 1110  BP: 124/78    General appearance:  Normal Thyroid:  Symmetrical, normal in size, without palpable masses or nodularity. Respiratory  Auscultation:  Clear without wheezing or rhonchi Cardiovascular  Auscultation:  Regular rate, without rubs, murmurs or gallops  Edema/varicosities:  Not grossly evident Abdominal  Soft,nontender, without masses, guarding or rebound.  Liver/spleen:  No organomegaly noted  Hernia:  None appreciated  Skin  Inspection:  Grossly normal   Breasts: Examined lying and sitting.     Right: Without masses, retractions, discharge or axillary adenopathy.     Left: Without masses, retractions, discharge or axillary adenopathy. Gentitourinary   Inguinal/mons:  Normal without inguinal adenopathy  External genitalia:  Normal  BUS/Urethra/Skene's glands:  Normal  Vagina:  Normal  Cervix:  Normal  Uterus:  normal in size, shape and contour.  Midline and mobile  Adnexa/parametria:     Rt: Without masses or tenderness.   Lt: Without masses or tenderness.  Anus and perineum: Normal  Digital rectal exam: Normal sphincter tone without palpated masses or tenderness  Assessment/Plan:  43 y.o. MWF G1P1` for annual exam no complaints.  Monthly cycle/vasectomy LGSIL 2006 with normal Paps after Asthma/allergies  Plan: Labs at  primary care. SBE's, continue annual mammogram, 3 D tomography reviewed and encouraged history of dense breast. Encouraged regular exercise, calcium rich diet, vitamin D 1000 daily. Pap with HR HPV typing, new screening guidelines reviewed.    [place], 12:23 PM 05/24/2013

## 2013-05-24 NOTE — Patient Instructions (Signed)

## 2013-07-25 ENCOUNTER — Other Ambulatory Visit: Payer: Self-pay | Admitting: Family Medicine

## 2013-11-25 ENCOUNTER — Encounter: Payer: Self-pay | Admitting: Women's Health

## 2014-03-24 ENCOUNTER — Other Ambulatory Visit: Payer: Self-pay

## 2014-03-24 DIAGNOSIS — Z1231 Encounter for screening mammogram for malignant neoplasm of breast: Secondary | ICD-10-CM

## 2014-04-02 ENCOUNTER — Ambulatory Visit
Admission: RE | Admit: 2014-04-02 | Discharge: 2014-04-02 | Disposition: A | Discharge status: Home / Self Care | Payer: BLUE CROSS/BLUE SHIELD | Source: Ambulatory Visit

## 2014-04-02 DIAGNOSIS — Z1231 Encounter for screening mammogram for malignant neoplasm of breast: Secondary | ICD-10-CM

## 2014-05-26 ENCOUNTER — Encounter: Payer: Self-pay | Admitting: Women's Health

## 2015-08-03 ENCOUNTER — Other Ambulatory Visit: Payer: Self-pay | Admitting: Physician Assistant

## 2015-08-03 DIAGNOSIS — Z1231 Encounter for screening mammogram for malignant neoplasm of breast: Secondary | ICD-10-CM

## 2015-08-11 ENCOUNTER — Ambulatory Visit
Admission: RE | Admit: 2015-08-11 | Discharge: 2015-08-11 | Disposition: A | Discharge status: Home / Self Care | Payer: BLUE CROSS/BLUE SHIELD | Source: Ambulatory Visit | Attending: Physician Assistant | Admitting: Physician Assistant

## 2015-08-11 DIAGNOSIS — Z1231 Encounter for screening mammogram for malignant neoplasm of breast: Secondary | ICD-10-CM

## 2016-07-20 ENCOUNTER — Other Ambulatory Visit: Payer: Self-pay | Admitting: Physician Assistant

## 2016-07-20 DIAGNOSIS — Z1231 Encounter for screening mammogram for malignant neoplasm of breast: Secondary | ICD-10-CM

## 2016-08-11 ENCOUNTER — Ambulatory Visit
Admission: RE | Admit: 2016-08-11 | Discharge: 2016-08-11 | Disposition: A | Discharge status: Home / Self Care | Payer: BLUE CROSS/BLUE SHIELD | Source: Ambulatory Visit | Attending: Physician Assistant | Admitting: Physician Assistant

## 2016-08-11 DIAGNOSIS — Z1231 Encounter for screening mammogram for malignant neoplasm of breast: Secondary | ICD-10-CM

## 2016-10-14 ENCOUNTER — Encounter: Payer: Self-pay | Admitting: Family Medicine

## 2018-11-15 ENCOUNTER — Other Ambulatory Visit: Payer: Self-pay | Admitting: Physician Assistant

## 2018-11-15 DIAGNOSIS — Z1231 Encounter for screening mammogram for malignant neoplasm of breast: Secondary | ICD-10-CM

## 2019-01-03 ENCOUNTER — Other Ambulatory Visit: Payer: Self-pay

## 2019-01-03 ENCOUNTER — Ambulatory Visit
Admission: RE | Admit: 2019-01-03 | Discharge: 2019-01-03 | Disposition: A | Discharge status: Home / Self Care | Payer: BC Managed Care – PPO | Source: Ambulatory Visit | Attending: Physician Assistant | Admitting: Physician Assistant

## 2019-01-03 DIAGNOSIS — Z1231 Encounter for screening mammogram for malignant neoplasm of breast: Secondary | ICD-10-CM

## 2020-10-01 ENCOUNTER — Ambulatory Visit (HOSPITAL_COMMUNITY): Payer: BC Managed Care – PPO | Admitting: Physical Therapy

## 2020-10-06 ENCOUNTER — Ambulatory Visit (HOSPITAL_COMMUNITY): Payer: BC Managed Care – PPO | Attending: Neurology

## 2020-10-06 ENCOUNTER — Encounter (HOSPITAL_COMMUNITY): Payer: Self-pay

## 2020-10-06 ENCOUNTER — Other Ambulatory Visit: Payer: Self-pay

## 2020-10-06 DIAGNOSIS — R42 Dizziness and giddiness: Secondary | ICD-10-CM | POA: Insufficient documentation

## 2020-10-06 DIAGNOSIS — R2681 Unsteadiness on feet: Secondary | ICD-10-CM

## 2020-10-06 NOTE — Patient Instructions (Signed)
Access Code: [email or web address] URL: [email or web address] Date: 10/06/2020 Prepared by: Sherlyn Lees  Exercises Seated Gaze Stabilization with Head Nod - 1 x daily - 7 x weekly - 3 sets - 10 reps Standing Gaze Stabilization with Two Near Targets and Head Rotation - 1 x daily - 7 x weekly - 3 sets - 10 reps Seated Gaze Stabilization with Head Nod and Vertical Arm Movement - 1 x daily - 7 x weekly - 3 sets - 10 reps

## 2020-10-06 NOTE — Therapy (Signed)
[facility] [place], [place], [address] Phone: [phone]   Fax:  [phone]  Physical Therapy Evaluation  Patient Details  Name: Diane Herring MRN: [id number] Date of Birth: Jun 22, 1970 Referring Provider (PT): Tamala Bari, DO Neurology   Encounter Date: 10/06/2020   PT End of Session - 10/06/20 1553     Visit Number 1    Number of Visits 4    Date for PT Re-Evaluation 11/03/20    Authorization Type BCBS Comm PPO, 60 VL    Authorization - Visit Number 1    Authorization - Number of Visits 60    PT Start Time 1600    PT Stop Time [id number]    PT Time Calculation (min) 45 min    Activity Tolerance Patient tolerated treatment well    Behavior During Therapy [facility] for tasks assessed/performed             Past Medical History:  Diagnosis Date   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 No/denies                [facility] PT Assessment - 10/06/20 0001       Assessment   Medical Diagnosis Vestibular neuritis    Referring Provider (PT) Tamala Bari, DO Neurology      Balance Screen   Has the patient fallen in the past 6 months No    Has the patient had a decrease in activity level because of a fear of falling?  No    Is the patient reluctant to leave their home because of a fear of falling?  No      Home Environment   Living Environment Private residence    Living  Arrangements Children    Available Help at Discharge Family    Type of [place] to enter    Home Layout One level      Prior Function   Level of Independence Independent    Vocation Full time employment    Dentist, computer job    Leisure read, TV      Observation/Other Assessments   Focus on Therapeutic Outcomes (FOTO)  45% function      Ambulation/Gait   Ambulation/Gait Yes    Ambulation/Gait Assistance 7: Independent      Balance   Balance Assessed Yes   SLS 10 sec                   Vestibular Assessment - 10/06/20 0001       Symptom Behavior   Subjective history of current problem Pt notes onset of sudden dizziness and nausea with onset in January with noting symptoms typically present during waking hours. Notes sound, light, sudden movements trigger her symptoms. Patient reports dizziness of 4/10 noting constant  symptoms but not as severe as the outset of symptoms.    Type of Dizziness  Unsteady with head/body turns;Lightheadedness;Vertigo    Frequency of Dizziness "consistent"    Duration of Dizziness whenever moving head    Symptom Nature Motion provoked;Positional;Variable    Aggravating Factors Spontaneous onset;Activity in general;Looking up to the ceiling;Sitting with head tilted back;Turning body quickly;Turning head quickly;Sitting in moving car;Walking in a crowd;Driving;Forward bending    Relieving Factors Lying supine   sleep   Progression of Symptoms Better      Oculomotor Exam   Ocular ROM WNL    Gaze-induced  Right beating nystagmus with R gaze    Smooth Pursuits Intact    Saccades Overshoots      Oculomotor Exam-Fixation Suppressed    Left Head Impulse symptoms    Right Head Impulse symptoms                Objective measurements completed on examination: See above findings.        Vestibular Treatment/Exercise - 10/06/20 0001       Vestibular Treatment/Exercise   Gaze  Exercises X1 Viewing Horizontal;X1 Viewing Vertical      X1 Viewing Horizontal   Reps 20    Comments supine      X1 Viewing Vertical   Reps 20    Comments supine                    PT Education - 10/06/20 1646     Education Details assessment findings, rationale for habituation tx    Person(s) Educated Patient    Methods Explanation;Handout    Comprehension Verbalized understanding              PT Short Term Goals - 10/06/20 1652       PT SHORT TERM GOAL #1   Title patient will rate dizziness 2/10 during mobility/functional activities to improve activity tolerance    Baseline 4/10 dizziness    Time 2    Period Weeks    Status New    Target Date 10/20/20      PT SHORT TERM GOAL #2   Title Patient will be independent in vestibular activities for HEP    Time 2    Period Weeks    Status New    Target Date 10/20/20               PT Long Term Goals - 10/06/20 1653       PT LONG TERM GOAL #1   Title Patient will rate dizziness 1/10 in severity to improve activity tolerance/participation    Baseline 4/10    Time 2    Period Weeks    Status New    Target Date 11/03/20      PT LONG TERM GOAL #2   Title Patient will improve FOTO score by at least 5 points in order to indicate improved tolerance to activity.    Baseline 45% function    Time 4    Period Weeks    Status New    Target Date 11/03/20                    Plan - 10/06/20 1649     Clinical Impression Statement Pt is 50 yo lady with diagnosis of right vesitbular neuritis and reports symptoms of constant dizziness and symptoms rated at 4/10 in severity during her waking hours and when performing day-to-day activities, work duties, and ADL.  As result  pt has experienced decline in functional activity tolerance, reduced activity participation, and requiring modification to all of her daily living.  Pt would benefit from PT services to develop/instruct in techniuqes to habituate  symptoms and provide activity modifications and treatment techniuqes to improve activity tolerance, reduce symptoms, and enhance quality of life    Personal Factors and Comorbidities Age;Time since onset of injury/illness/exacerbation    Examination-Activity Limitations Bend;Carry;Lift;Stand;Stairs;Squat;Locomotion Level    Examination-Participation Restrictions Church;Cleaning;Community Activity;Driving;Yard Work;Occupation    Stability/Clinical Decision Making Stable/Uncomplicated    Clinical Decision Making Low    Rehab Potential Fair    PT Frequency 1x / week    PT Duration 4 weeks    PT Treatment/Interventions ADLs/Self Care Home Management;Canalith Repostioning;Gait training;Stair training;Functional mobility training;Therapeutic activities;Therapeutic exercise;Balance training;Patient/family education;Neuromuscular re-education;Manual techniques;Passive range of motion;Vestibular    PT Next Visit Plan gaze stabilization, habituation    PT Home Exercise Plan supine VOR 1-2    Consulted and Agree with Plan of Care Patient             Patient will benefit from skilled therapeutic intervention in order to improve the following deficits and impairments:  Decreased activity tolerance, Decreased balance, Dizziness  Visit Diagnosis: Dizziness and giddiness  Unsteadiness on feet     Problem List Patient Active Problem List   Diagnosis Date Noted   OSA (obstructive sleep apnea) 06/16/2011   Allergic reaction to drug 02/01/2011   Dysplastic nevi 12/07/2010   ASTHMA 08/24/2006   ALLERGIC RHINITIS 08/22/2006   DISORDER, MENSTRUAL NOS 08/22/2006   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 Sherlyn Lees, PT, DPT Physical Therapist- [place] Office Number: [phone]   [facility] [address] [place], [place], [address] Phone: [phone]   Fax:  [phone]  Name: Diane Herring MRN: [id number] Date of Birth:  Jun 28, 1970

## 2020-12-04 NOTE — Addendum Note (Signed)
Addended by: Toniann Fail on: 12/04/2020 07:39 AM   Modules accepted: Orders

## 2021-07-07 IMAGING — MG DIGITAL SCREENING BILAT W/ TOMO W/ CAD
8 series · 8 of 24 positions shown · non-contrast
Comparison: Previous exam(s).

CLINICAL DATA: Screening.

EXAM:
DIGITAL SCREENING BILATERAL MAMMOGRAM WITH TOMO AND CAD

[L MLO synth-2D]
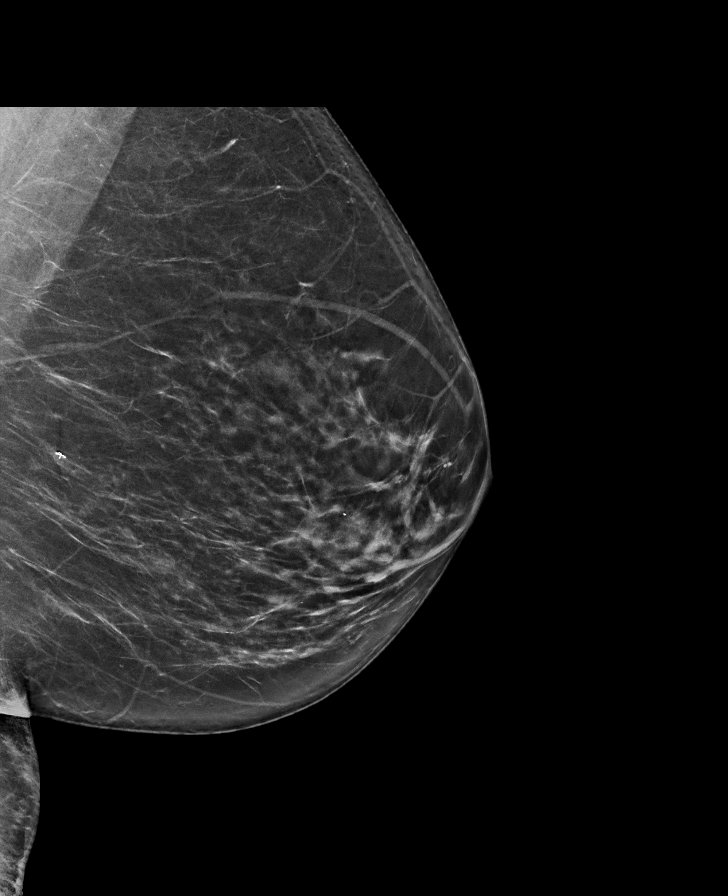

[R CC synth-2D]
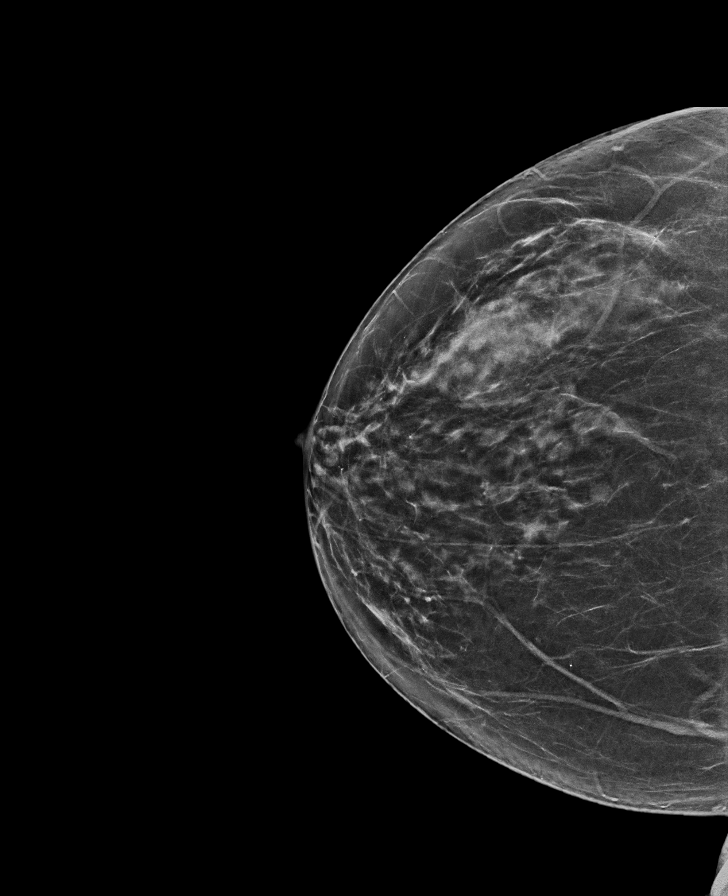

[R MLO synth-2D]
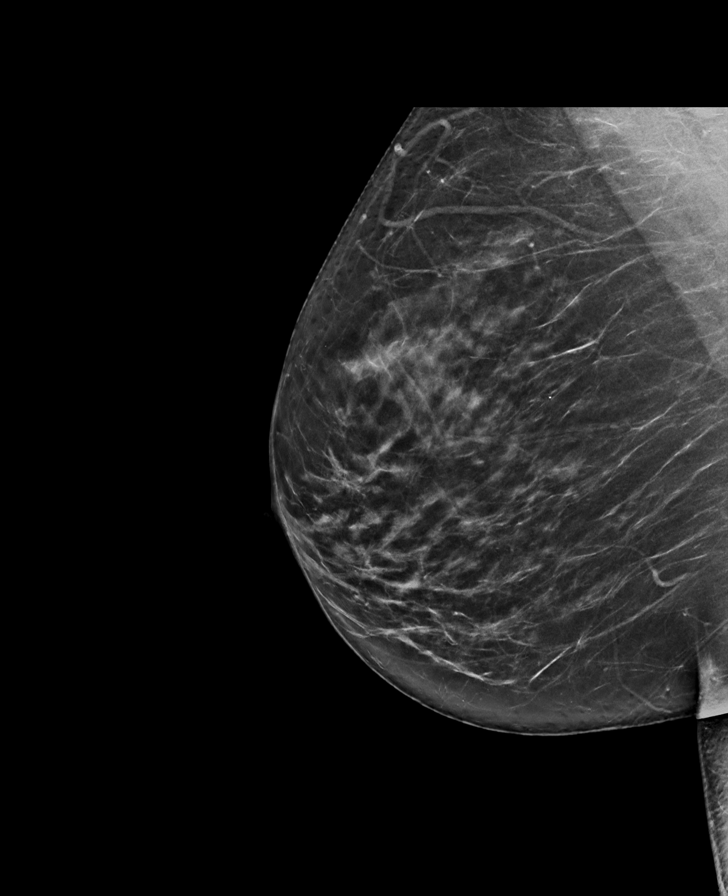

[L CC synth-2D]
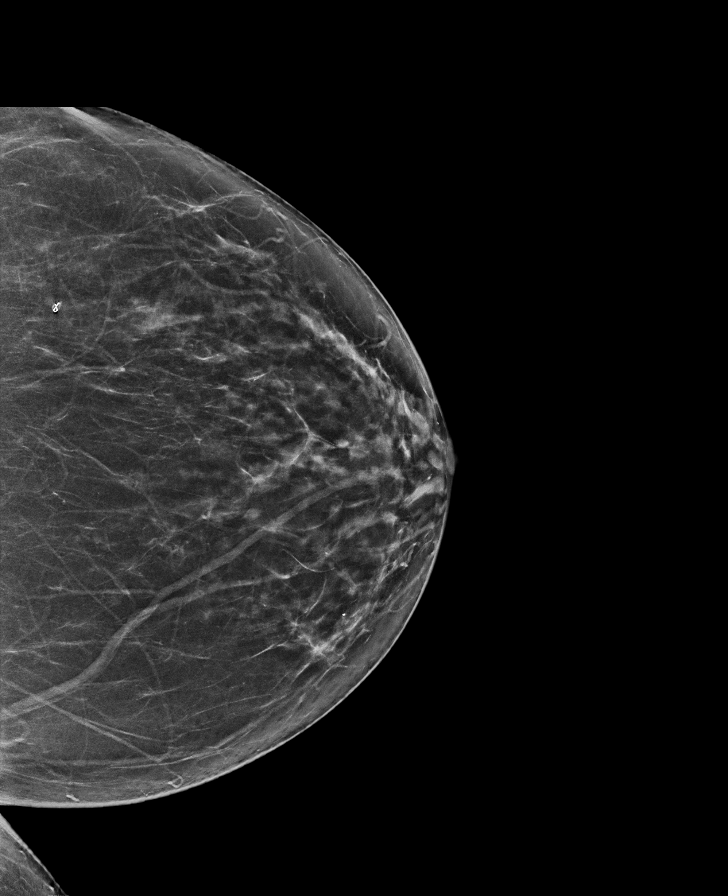

[L CC tomo · tomo slice 35/68.0]
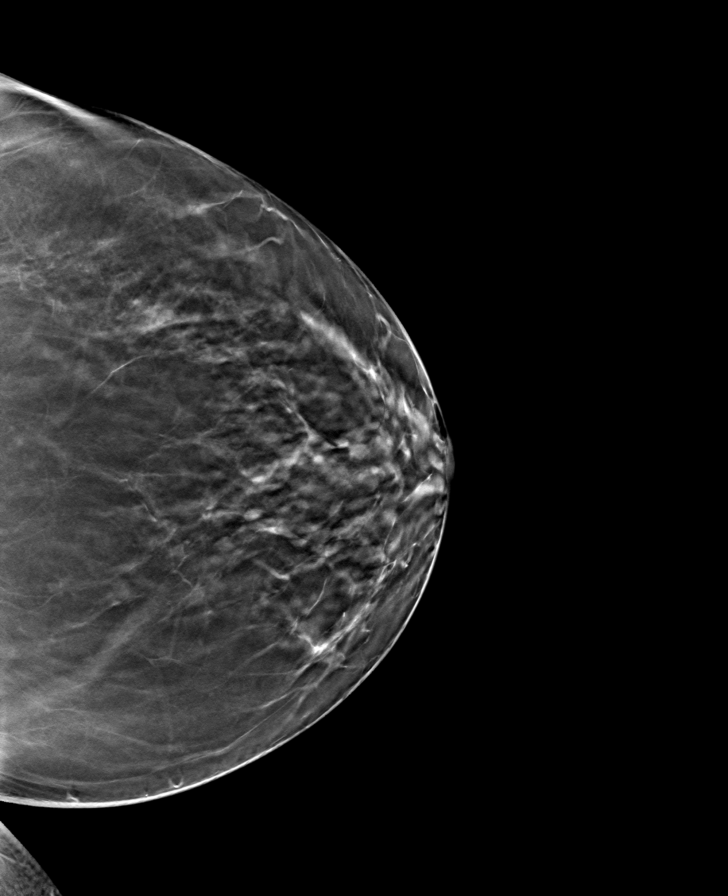

[L MLO tomo · tomo slice 37/73.0]
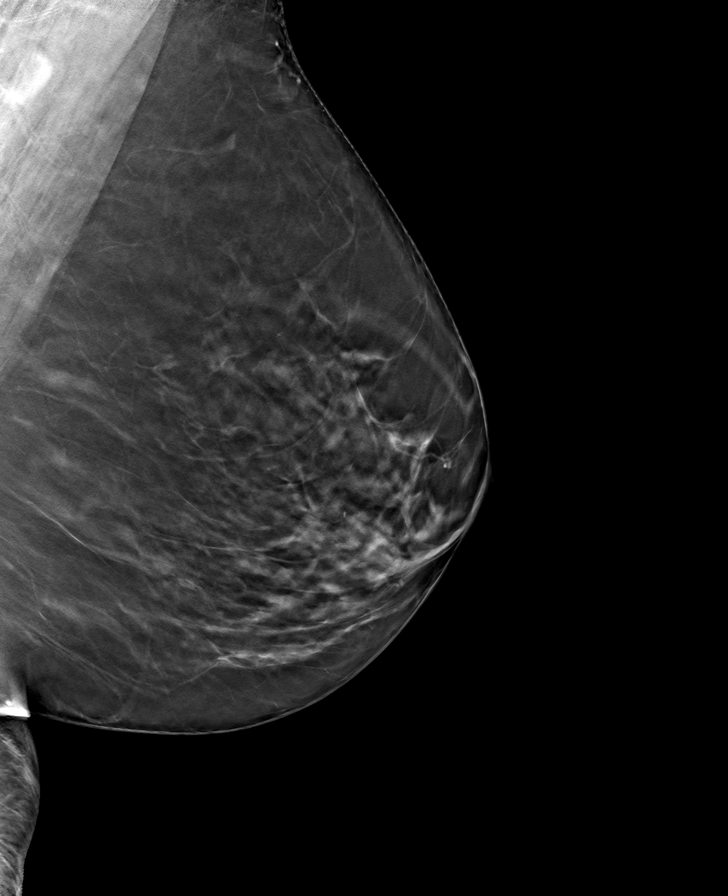

[R MLO tomo · tomo slice 37/73.0]
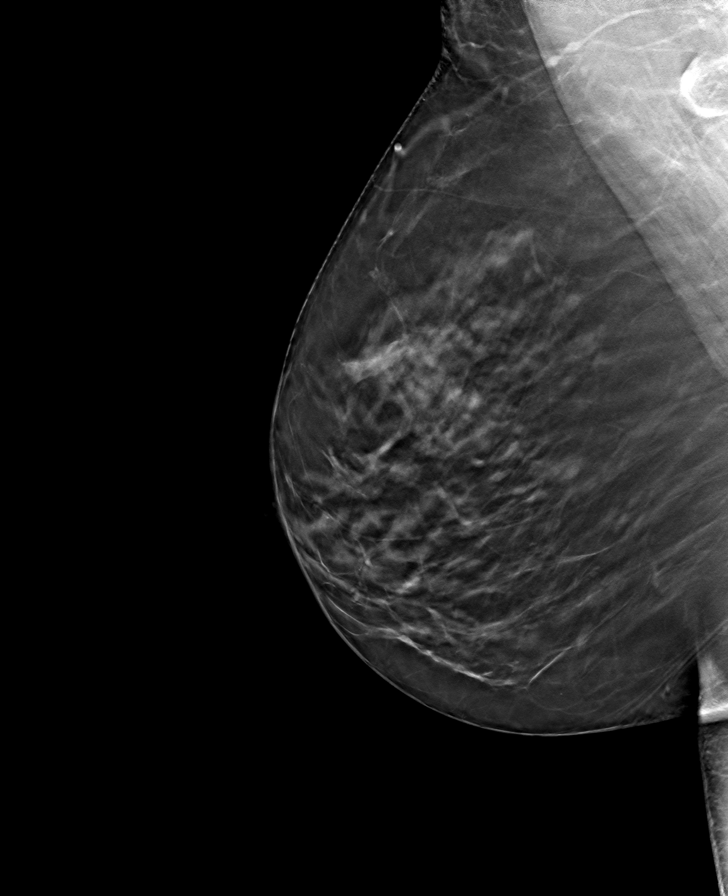

[R CC tomo · tomo slice 33/66.0]
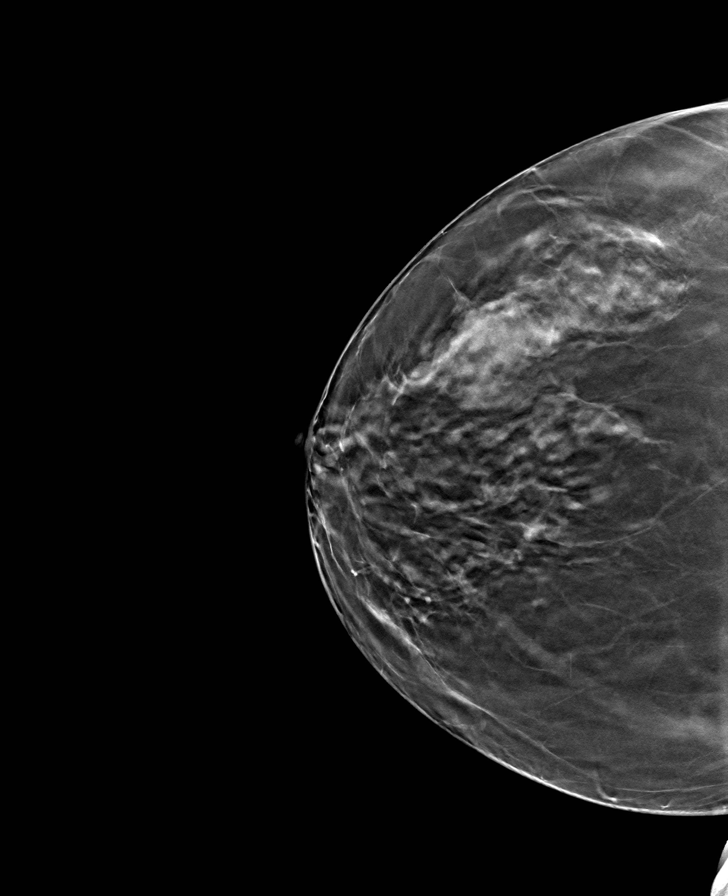

[8 of 24 positions shown; findings below may reference images not displayed]

ACR Breast Density Category c: The breast tissue is heterogeneously
dense, which may obscure small masses.
FINDINGS: There are no findings suspicious for malignancy. Images were
processed with CAD.
IMPRESSION: No mammographic evidence of malignancy. A result letter of this
screening mammogram will be mailed directly to the patient.

RECOMMENDATION:
Screening mammogram in one year. (Code:FT-U-LHB)

BI-RADS CATEGORY  1: Negative.

## 2022-06-06 ENCOUNTER — Encounter (HOSPITAL_BASED_OUTPATIENT_CLINIC_OR_DEPARTMENT_OTHER): Payer: Self-pay

## 2022-06-06 ENCOUNTER — Other Ambulatory Visit (HOSPITAL_BASED_OUTPATIENT_CLINIC_OR_DEPARTMENT_OTHER): Payer: Self-pay

## 2022-06-06 MED ORDER — WEGOVY 0.25 MG/0.5ML ~~LOC~~ SOAJ
0.2500 mg | SUBCUTANEOUS | 0 refills | Status: AC
  Filled 2022-06-06: qty 2, 28d supply, fill #0

## 2022-06-07 ENCOUNTER — Other Ambulatory Visit (HOSPITAL_BASED_OUTPATIENT_CLINIC_OR_DEPARTMENT_OTHER): Payer: Self-pay

## 2022-07-05 ENCOUNTER — Other Ambulatory Visit (HOSPITAL_BASED_OUTPATIENT_CLINIC_OR_DEPARTMENT_OTHER): Payer: Self-pay

## 2022-07-05 MED ORDER — WEGOVY 0.5 MG/0.5ML ~~LOC~~ SOAJ
0.5000 mg | SUBCUTANEOUS | 0 refills | Status: AC
  Filled 2022-07-05: qty 2, 28d supply, fill #0

## 2022-07-31 ENCOUNTER — Other Ambulatory Visit (HOSPITAL_BASED_OUTPATIENT_CLINIC_OR_DEPARTMENT_OTHER): Payer: Self-pay

## 2022-07-31 MED ORDER — WEGOVY 1 MG/0.5ML ~~LOC~~ SOAJ
1.0000 mg | SUBCUTANEOUS | 0 refills | Status: DC
  Filled 2022-07-31: qty 2, 28d supply, fill #0

## 2022-08-01 ENCOUNTER — Other Ambulatory Visit: Payer: Self-pay

## 2022-08-09 ENCOUNTER — Other Ambulatory Visit (HOSPITAL_BASED_OUTPATIENT_CLINIC_OR_DEPARTMENT_OTHER): Payer: Self-pay

## 2022-08-09 MED ORDER — WEGOVY 1.7 MG/0.75ML ~~LOC~~ SOAJ
1.7000 mg | SUBCUTANEOUS | 0 refills | Status: AC
  Filled 2022-08-09 – 2022-08-23 (×2): qty 3, 28d supply, fill #0

## 2022-08-10 ENCOUNTER — Other Ambulatory Visit (HOSPITAL_COMMUNITY): Payer: Self-pay

## 2022-08-10 ENCOUNTER — Other Ambulatory Visit (HOSPITAL_BASED_OUTPATIENT_CLINIC_OR_DEPARTMENT_OTHER): Payer: Self-pay

## 2022-08-23 ENCOUNTER — Other Ambulatory Visit (HOSPITAL_BASED_OUTPATIENT_CLINIC_OR_DEPARTMENT_OTHER): Payer: Self-pay

## 2022-08-29 ENCOUNTER — Other Ambulatory Visit (HOSPITAL_BASED_OUTPATIENT_CLINIC_OR_DEPARTMENT_OTHER): Payer: Self-pay

## 2022-08-29 MED ORDER — LAGEVRIO 200 MG PO CAPS
ORAL_CAPSULE | ORAL | 0 refills | Status: AC
  Filled 2022-08-29: qty 40, 5d supply, fill #0

## 2022-09-20 ENCOUNTER — Other Ambulatory Visit (HOSPITAL_BASED_OUTPATIENT_CLINIC_OR_DEPARTMENT_OTHER): Payer: Self-pay

## 2022-09-20 MED ORDER — WEGOVY 1.7 MG/0.75ML ~~LOC~~ SOAJ
1.7000 mg | SUBCUTANEOUS | 0 refills | Status: AC
  Filled 2022-09-20: qty 3, 28d supply, fill #0
  Filled 2022-10-17 – 2022-10-26 (×4): qty 3, 28d supply, fill #1
  Filled 2022-11-14 – 2022-11-16 (×2): qty 3, 28d supply, fill #2

## 2022-09-21 ENCOUNTER — Other Ambulatory Visit (HOSPITAL_BASED_OUTPATIENT_CLINIC_OR_DEPARTMENT_OTHER): Payer: Self-pay

## 2022-09-23 ENCOUNTER — Other Ambulatory Visit (HOSPITAL_BASED_OUTPATIENT_CLINIC_OR_DEPARTMENT_OTHER): Payer: Self-pay

## 2022-10-17 ENCOUNTER — Other Ambulatory Visit (HOSPITAL_BASED_OUTPATIENT_CLINIC_OR_DEPARTMENT_OTHER): Payer: Self-pay

## 2022-10-19 ENCOUNTER — Other Ambulatory Visit (HOSPITAL_COMMUNITY): Payer: Self-pay

## 2022-10-19 ENCOUNTER — Other Ambulatory Visit (HOSPITAL_BASED_OUTPATIENT_CLINIC_OR_DEPARTMENT_OTHER): Payer: Self-pay

## 2022-10-26 ENCOUNTER — Other Ambulatory Visit (HOSPITAL_BASED_OUTPATIENT_CLINIC_OR_DEPARTMENT_OTHER): Payer: Self-pay

## 2022-11-10 ENCOUNTER — Other Ambulatory Visit (HOSPITAL_BASED_OUTPATIENT_CLINIC_OR_DEPARTMENT_OTHER): Payer: Self-pay

## 2022-11-14 ENCOUNTER — Other Ambulatory Visit (HOSPITAL_BASED_OUTPATIENT_CLINIC_OR_DEPARTMENT_OTHER): Payer: Self-pay

## 2022-11-17 ENCOUNTER — Other Ambulatory Visit (HOSPITAL_BASED_OUTPATIENT_CLINIC_OR_DEPARTMENT_OTHER): Payer: Self-pay

## 2022-12-19 ENCOUNTER — Other Ambulatory Visit (HOSPITAL_BASED_OUTPATIENT_CLINIC_OR_DEPARTMENT_OTHER): Payer: Self-pay

## 2022-12-19 MED ORDER — WEGOVY 1.7 MG/0.75ML ~~LOC~~ SOAJ
1.7000 mg | SUBCUTANEOUS | 0 refills | Status: DC
  Filled 2022-12-19: qty 3, 28d supply, fill #0
  Filled 2023-01-17: qty 3, 28d supply, fill #1

## 2022-12-23 ENCOUNTER — Other Ambulatory Visit (HOSPITAL_BASED_OUTPATIENT_CLINIC_OR_DEPARTMENT_OTHER): Payer: Self-pay

## 2023-02-06 ENCOUNTER — Other Ambulatory Visit (HOSPITAL_BASED_OUTPATIENT_CLINIC_OR_DEPARTMENT_OTHER): Payer: Self-pay

## 2023-02-06 ENCOUNTER — Encounter (HOSPITAL_BASED_OUTPATIENT_CLINIC_OR_DEPARTMENT_OTHER): Payer: Self-pay

## 2023-02-06 MED ORDER — WEGOVY 2.4 MG/0.75ML ~~LOC~~ SOAJ
2.4000 mg | SUBCUTANEOUS | 0 refills | Status: DC
  Filled 2023-02-06 – 2023-02-18 (×3): qty 3, 28d supply, fill #0
  Filled 2023-03-31: qty 3, 28d supply, fill #1
  Filled 2023-05-24 – 2023-06-09 (×3): qty 3, 28d supply, fill #2

## 2023-02-07 ENCOUNTER — Encounter (HOSPITAL_BASED_OUTPATIENT_CLINIC_OR_DEPARTMENT_OTHER): Payer: Self-pay

## 2023-02-07 ENCOUNTER — Other Ambulatory Visit (HOSPITAL_BASED_OUTPATIENT_CLINIC_OR_DEPARTMENT_OTHER): Payer: Self-pay

## 2023-02-09 ENCOUNTER — Other Ambulatory Visit (HOSPITAL_BASED_OUTPATIENT_CLINIC_OR_DEPARTMENT_OTHER): Payer: Self-pay

## 2023-02-17 ENCOUNTER — Other Ambulatory Visit (HOSPITAL_BASED_OUTPATIENT_CLINIC_OR_DEPARTMENT_OTHER): Payer: Self-pay

## 2023-02-18 ENCOUNTER — Other Ambulatory Visit (HOSPITAL_BASED_OUTPATIENT_CLINIC_OR_DEPARTMENT_OTHER): Payer: Self-pay

## 2023-02-20 ENCOUNTER — Other Ambulatory Visit (HOSPITAL_BASED_OUTPATIENT_CLINIC_OR_DEPARTMENT_OTHER): Payer: Self-pay

## 2023-03-31 ENCOUNTER — Other Ambulatory Visit (HOSPITAL_BASED_OUTPATIENT_CLINIC_OR_DEPARTMENT_OTHER): Payer: Self-pay

## 2023-03-31 ENCOUNTER — Other Ambulatory Visit: Payer: Self-pay

## 2023-04-08 ENCOUNTER — Other Ambulatory Visit (HOSPITAL_BASED_OUTPATIENT_CLINIC_OR_DEPARTMENT_OTHER): Payer: Self-pay

## 2023-05-24 ENCOUNTER — Other Ambulatory Visit (HOSPITAL_BASED_OUTPATIENT_CLINIC_OR_DEPARTMENT_OTHER): Payer: Self-pay

## 2023-06-05 ENCOUNTER — Other Ambulatory Visit (HOSPITAL_BASED_OUTPATIENT_CLINIC_OR_DEPARTMENT_OTHER): Payer: Self-pay

## 2023-06-09 ENCOUNTER — Other Ambulatory Visit (HOSPITAL_BASED_OUTPATIENT_CLINIC_OR_DEPARTMENT_OTHER): Payer: Self-pay

## 2023-08-02 ENCOUNTER — Other Ambulatory Visit (HOSPITAL_BASED_OUTPATIENT_CLINIC_OR_DEPARTMENT_OTHER): Payer: Self-pay

## 2023-08-02 MED ORDER — WEGOVY 2.4 MG/0.75ML ~~LOC~~ SOAJ
2.4000 mg | SUBCUTANEOUS | 0 refills | Status: DC
  Filled 2023-08-02: qty 3, 28d supply, fill #0
